# Patient Record
Sex: Female | Born: 1935 | Race: White | Hispanic: No | Marital: Married | Smoking: Former smoker
Health system: Southern US, Community
[De-identification: ages and names within clinical notes are randomized; demographics above are authoritative.]

## PROBLEM LIST (undated history)

## (undated) DIAGNOSIS — F0390 Unspecified dementia without behavioral disturbance: Secondary | ICD-10-CM

## (undated) DIAGNOSIS — J45909 Unspecified asthma, uncomplicated: Secondary | ICD-10-CM

## (undated) DIAGNOSIS — J449 Chronic obstructive pulmonary disease, unspecified: Secondary | ICD-10-CM

## (undated) HISTORY — PX: NASAL POLYP SURGERY: SHX186

## (undated) HISTORY — PX: CATARACT EXTRACTION: SUR2

---

## 2004-04-12 ENCOUNTER — Ambulatory Visit: Payer: Self-pay | Admitting: Ophthalmology

## 2004-07-26 ENCOUNTER — Other Ambulatory Visit: Payer: Self-pay

## 2004-07-28 ENCOUNTER — Inpatient Hospital Stay: Payer: Self-pay | Admitting: Internal Medicine

## 2004-11-05 ENCOUNTER — Ambulatory Visit: Payer: Self-pay | Admitting: Internal Medicine

## 2006-11-27 ENCOUNTER — Ambulatory Visit: Payer: Self-pay | Admitting: Internal Medicine

## 2007-11-27 ENCOUNTER — Ambulatory Visit: Payer: Self-pay | Admitting: Internal Medicine

## 2011-09-05 DEATH — deceased

## 2012-06-28 ENCOUNTER — Ambulatory Visit: Payer: Self-pay | Admitting: Internal Medicine

## 2012-12-31 ENCOUNTER — Ambulatory Visit: Payer: Self-pay | Admitting: Specialist

## 2013-07-02 ENCOUNTER — Other Ambulatory Visit: Payer: Self-pay | Admitting: Internal Medicine

## 2013-07-07 LAB — CULTURE, BLOOD (SINGLE)

## 2014-06-02 IMAGING — CT CT HEAD WITHOUT AND WITH CONTRAST
1 of 2 series · 13 of 30 positions shown, 17 images · non-contrast
Comparison: none

REASON FOR EXAM: Confusion Weight Loss Poss Lung Mass
COMMENTS:

[Series 2: without · axial · non-contrast · 0.40mm/px · z∈[-170,-50]mm · 13 of 30 slices shown, 17 images]
[im 3/30  brain]
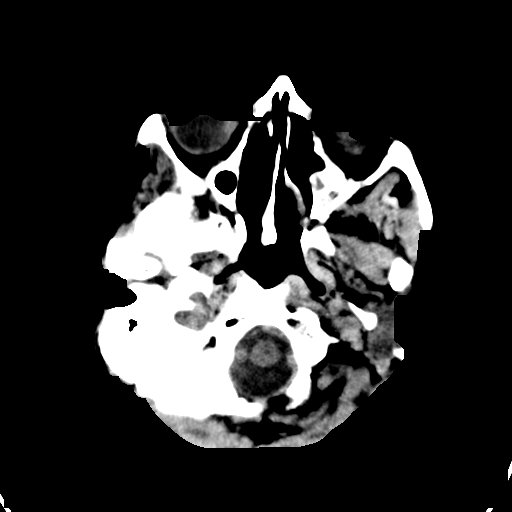
[im 3/30  bone]
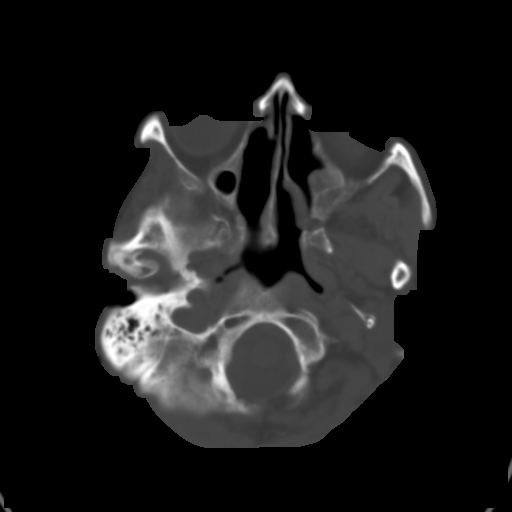
[im 5/30  brain]
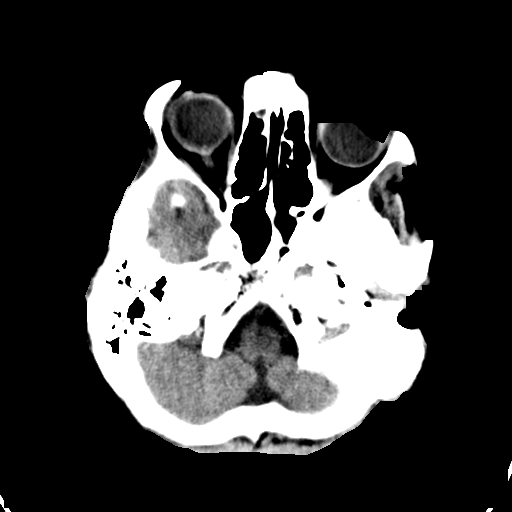
[im 7/30  brain]
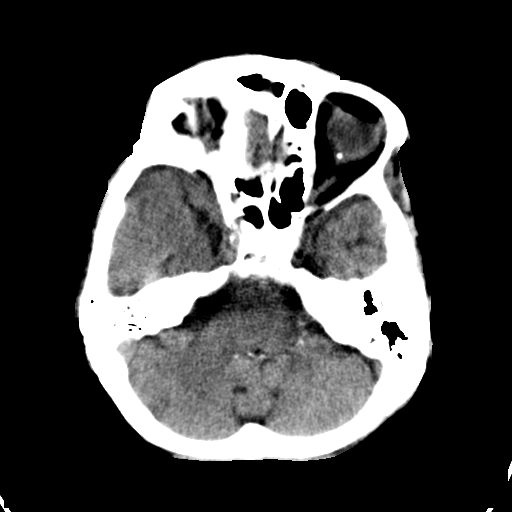
[im 9/30  brain]
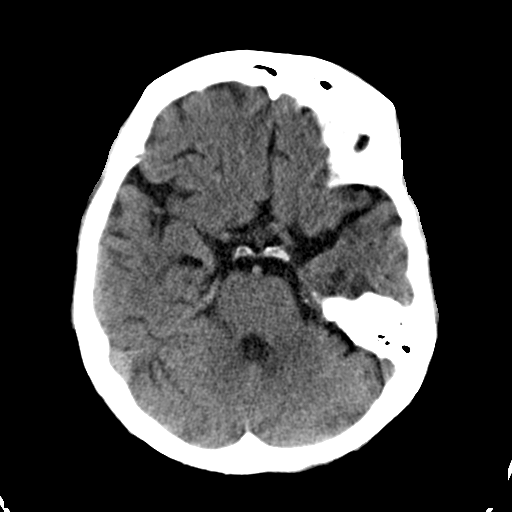
[im 11/30  brain]
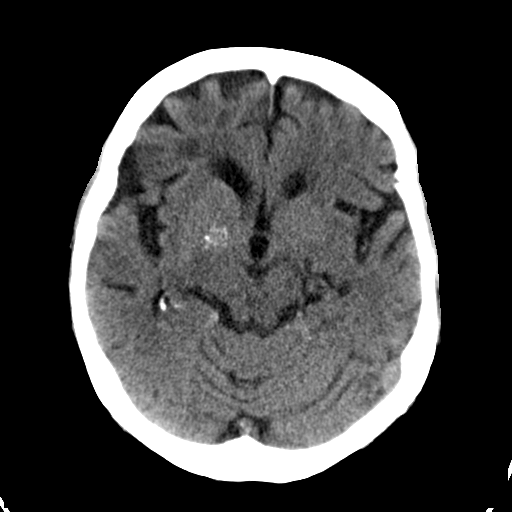
[im 11/30  bone]
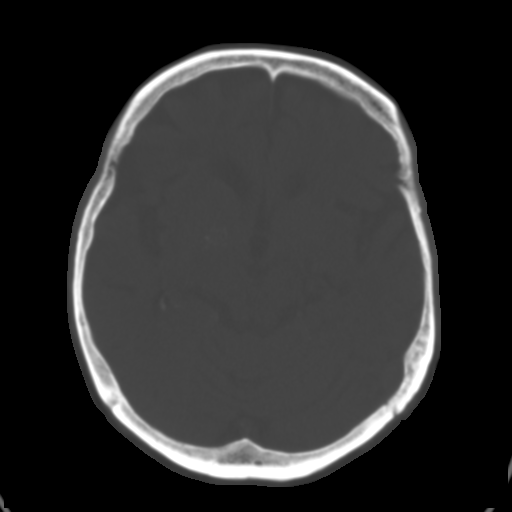
[im 13/30  brain]
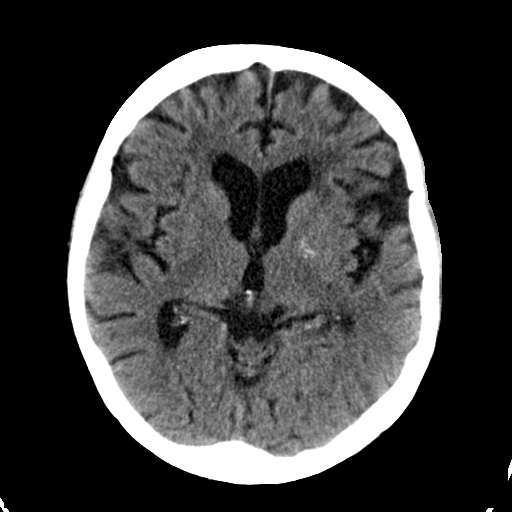
[im 15/30  brain]
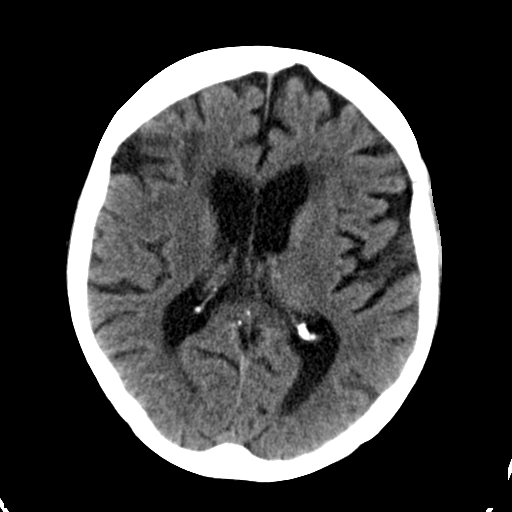
[im 17/30  brain]
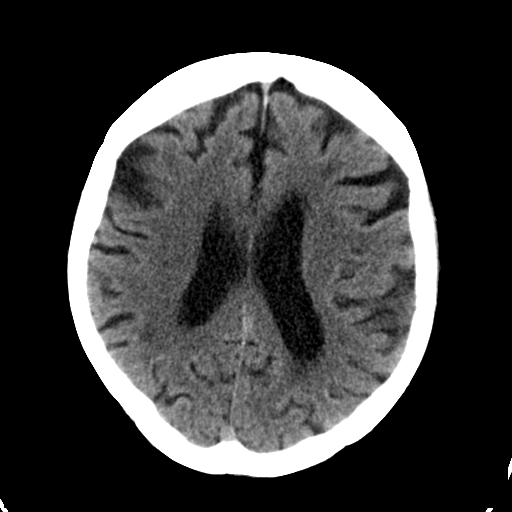
[im 19/30  brain]
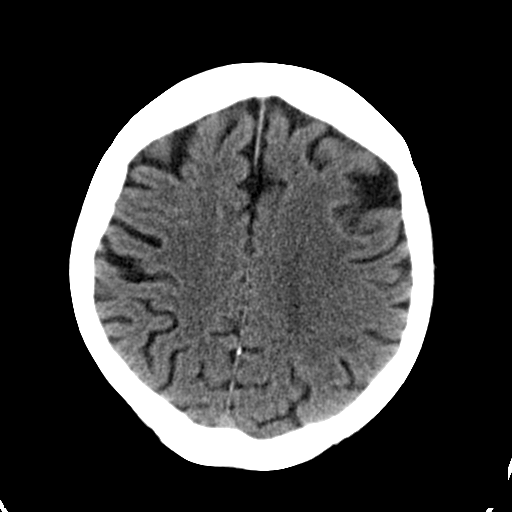
[im 19/30  bone]
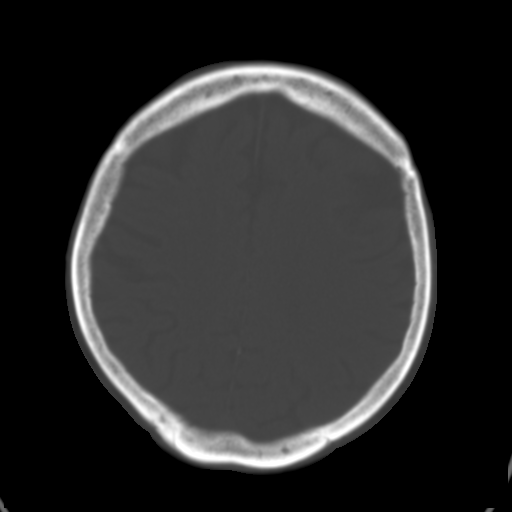
[im 21/30  brain]
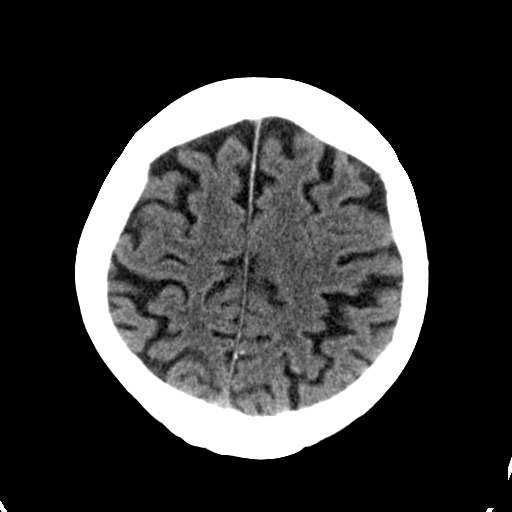
[im 23/30  brain]
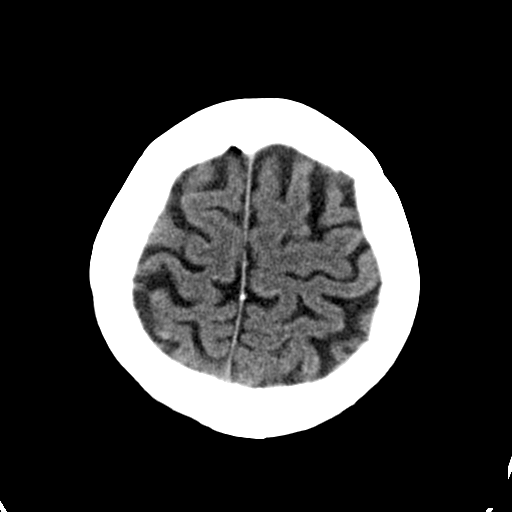
[im 25/30  brain]
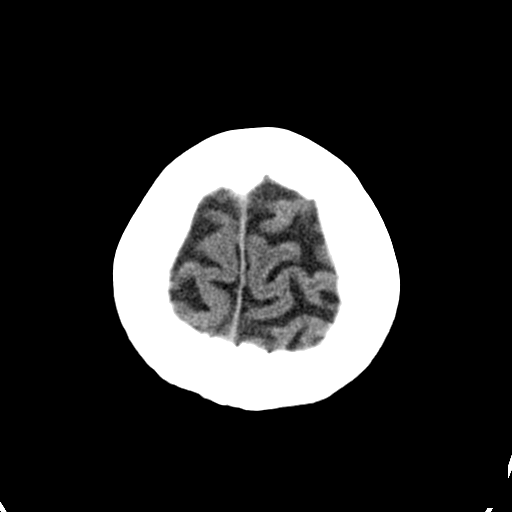
[im 27/30  brain]
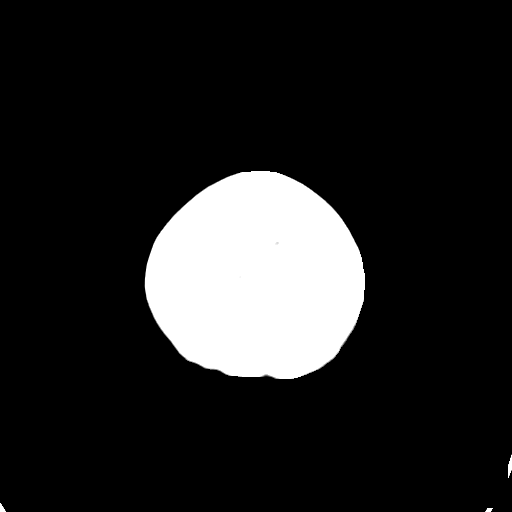
[im 27/30  bone]
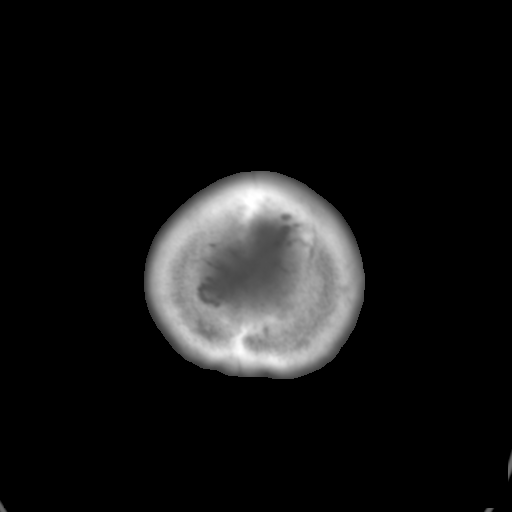

[13 of 30 positions shown; findings below may reference images not displayed]

PROCEDURE:     CT  - CT HEAD W/WO  - June 28, 2012 [DATE]

RESULT:     CT of the brain without and with contrast is performed utilizing
70 mL of Qsovue-DD2 iodinated intravenous contrast. The patient has no
previous exam for comparison.

Precontrast images demonstrate mild prominence of the ventricles and sulci.
Symmetric bilateral basal ganglia calcification is present. There is
low-attenuation diffusely in the periventricular and subcortical white
matter. There is no intracranial hemorrhage, mass, mass effect or midline
shift. No territorial infarct is evident. Images obtained following
intravenous administration of contrast demonstrate no evidence of abnormal
enhancement. There is an air-fluid level in the left maxillary sinus.
Patient has a history of sinusitis previous sinus surgery. There is partial
opacification in the left mastoid air cells extending into the left mastoid
tip there is some sclerosis. The right mastoid appears to be normally
aerated. There is no depressed skull fracture. No lytic or sclerotic
calvarial lesion is demonstrated.
IMPRESSION: 1. Changes of atrophy with chronic microvascular ischemic disease. No acute
intracranial abnormality demonstrated.
2. Air-fluid level in the left maxillary sinus. Correlate for acute
sinusitis.
3. Chronic changes in the left mastoid as described.
4. No evidence of an acute or chronic intracranial mass, mass effect or
evidence of hemorrhage.

[REDACTED]

## 2016-04-28 ENCOUNTER — Inpatient Hospital Stay
Admission: EM | Admit: 2016-04-28 | Discharge: 2016-05-05 | DRG: 193 | Disposition: E | Payer: Medicare Other | Attending: Internal Medicine | Admitting: Internal Medicine

## 2016-04-28 ENCOUNTER — Emergency Department: Payer: Medicare Other

## 2016-04-28 ENCOUNTER — Encounter: Payer: Self-pay | Admitting: Emergency Medicine

## 2016-04-28 DIAGNOSIS — J440 Chronic obstructive pulmonary disease with acute lower respiratory infection: Secondary | ICD-10-CM | POA: Diagnosis present

## 2016-04-28 DIAGNOSIS — R4182 Altered mental status, unspecified: Secondary | ICD-10-CM | POA: Diagnosis present

## 2016-04-28 DIAGNOSIS — Z66 Do not resuscitate: Secondary | ICD-10-CM | POA: Diagnosis present

## 2016-04-28 DIAGNOSIS — Z515 Encounter for palliative care: Secondary | ICD-10-CM | POA: Diagnosis present

## 2016-04-28 DIAGNOSIS — F0280 Dementia in other diseases classified elsewhere without behavioral disturbance: Secondary | ICD-10-CM | POA: Diagnosis present

## 2016-04-28 DIAGNOSIS — F0390 Unspecified dementia without behavioral disturbance: Secondary | ICD-10-CM | POA: Diagnosis present

## 2016-04-28 DIAGNOSIS — E875 Hyperkalemia: Secondary | ICD-10-CM | POA: Diagnosis not present

## 2016-04-28 DIAGNOSIS — J181 Lobar pneumonia, unspecified organism: Secondary | ICD-10-CM

## 2016-04-28 DIAGNOSIS — G92 Toxic encephalopathy: Secondary | ICD-10-CM | POA: Diagnosis present

## 2016-04-28 DIAGNOSIS — Z87891 Personal history of nicotine dependence: Secondary | ICD-10-CM

## 2016-04-28 DIAGNOSIS — J449 Chronic obstructive pulmonary disease, unspecified: Secondary | ICD-10-CM | POA: Diagnosis present

## 2016-04-28 DIAGNOSIS — J441 Chronic obstructive pulmonary disease with (acute) exacerbation: Secondary | ICD-10-CM | POA: Diagnosis present

## 2016-04-28 DIAGNOSIS — Z681 Body mass index (BMI) 19 or less, adult: Secondary | ICD-10-CM

## 2016-04-28 DIAGNOSIS — H9190 Unspecified hearing loss, unspecified ear: Secondary | ICD-10-CM | POA: Diagnosis present

## 2016-04-28 DIAGNOSIS — E43 Unspecified severe protein-calorie malnutrition: Secondary | ICD-10-CM | POA: Diagnosis present

## 2016-04-28 DIAGNOSIS — J9602 Acute respiratory failure with hypercapnia: Secondary | ICD-10-CM | POA: Diagnosis present

## 2016-04-28 DIAGNOSIS — J9601 Acute respiratory failure with hypoxia: Secondary | ICD-10-CM | POA: Diagnosis present

## 2016-04-28 DIAGNOSIS — J189 Pneumonia, unspecified organism: Principal | ICD-10-CM | POA: Diagnosis present

## 2016-04-28 HISTORY — DX: Unspecified dementia, unspecified severity, without behavioral disturbance, psychotic disturbance, mood disturbance, and anxiety: F03.90

## 2016-04-28 HISTORY — DX: Unspecified asthma, uncomplicated: J45.909

## 2016-04-28 HISTORY — DX: Chronic obstructive pulmonary disease, unspecified: J44.9

## 2016-04-28 LAB — BASIC METABOLIC PANEL
ANION GAP: 6 (ref 5–15)
BUN: 32 mg/dL — ABNORMAL HIGH (ref 6–20)
CHLORIDE: 103 mmol/L (ref 101–111)
CO2: 32 mmol/L (ref 22–32)
Calcium: 9.7 mg/dL (ref 8.9–10.3)
Creatinine, Ser: 0.66 mg/dL (ref 0.44–1.00)
GFR calc non Af Amer: >60 mL/min (ref >60)
Glucose, Bld: 107 mg/dL — ABNORMAL HIGH (ref 65–99)
POTASSIUM: 4.9 mmol/L (ref 3.5–5.1)
SODIUM: 141 mmol/L (ref 135–145)

## 2016-04-28 LAB — BLOOD GAS, ARTERIAL
ALLENS TEST (PASS/FAIL): POSITIVE — AB
Acid-Base Excess: 5.2 mmol/L — ABNORMAL HIGH (ref 0.0–2.0)
BICARBONATE: 33.5 mmol/L — AB (ref 20.0–28.0)
FIO2: 0.28
O2 SAT: 94.3 %
PATIENT TEMPERATURE: 37
PCO2 ART: 65 mmHg — AB (ref 32.0–48.0)
PO2 ART: 78 mmHg — AB (ref 83.0–108.0)
pH, Arterial: 7.32 — ABNORMAL LOW (ref 7.350–7.450)

## 2016-04-28 LAB — CBC
HEMATOCRIT: 44.3 % (ref 35.0–47.0)
HEMOGLOBIN: 14.8 g/dL (ref 12.0–16.0)
MCH: 30.3 pg (ref 26.0–34.0)
MCHC: 33.4 g/dL (ref 32.0–36.0)
MCV: 90.7 fL (ref 80.0–100.0)
Platelets: 180 10*3/uL (ref 150–440)
RBC: 4.89 MIL/uL (ref 3.80–5.20)
RDW: 14.3 % (ref 11.5–14.5)
WBC: 6.6 10*3/uL (ref 3.6–11.0)

## 2016-04-28 LAB — URINALYSIS, COMPLETE (UACMP) WITH MICROSCOPIC
BACTERIA UA: NONE SEEN
BILIRUBIN URINE: NEGATIVE
Glucose, UA: NEGATIVE mg/dL
HGB URINE DIPSTICK: NEGATIVE
KETONES UR: 5 mg/dL — AB
LEUKOCYTES UA: NEGATIVE
NITRITE: NEGATIVE
Protein, ur: 30 mg/dL — AB
SQUAMOUS EPITHELIAL / LPF: NONE SEEN
Specific Gravity, Urine: >1.046 — ABNORMAL HIGH (ref 1.005–1.030)
pH: 5 (ref 5.0–8.0)

## 2016-04-28 LAB — LACTIC ACID, PLASMA: Lactic Acid, Venous: 1.3 mmol/L (ref 0.5–1.9)

## 2016-04-28 LAB — GLUCOSE, CAPILLARY: GLUCOSE-CAPILLARY: 86 mg/dL (ref 65–99)

## 2016-04-28 LAB — TROPONIN I: Troponin I: <0.03 ng/mL (ref <0.03)

## 2016-04-28 MED ORDER — ENOXAPARIN SODIUM 40 MG/0.4ML ~~LOC~~ SOLN: 40.0000 mg | SUBCUTANEOUS | Status: DC

## 2016-04-28 MED ORDER — ONDANSETRON HCL 4 MG/2ML IJ SOLN: 4.0000 mg | Freq: Four times a day (QID) | INTRAMUSCULAR | Status: DC | PRN

## 2016-04-28 MED ORDER — ACETAMINOPHEN 650 MG RE SUPP
650.0000 mg | Freq: Four times a day (QID) | RECTAL | Status: DC | PRN
Start: 2016-04-28 — End: 2016-05-05

## 2016-04-28 MED ORDER — DEXTROSE 5 % IV SOLN
500.0000 mg | INTRAVENOUS | Status: DC
  Administered 2016-04-29: 500 mg via INTRAVENOUS
  Filled 2016-04-28 (×3): qty 500

## 2016-04-28 MED ORDER — DEXTROSE 5 % IV SOLN: 1.0000 g | Freq: Once | INTRAVENOUS | Status: DC

## 2016-04-28 MED ORDER — DEXTROSE 5 % IV SOLN
1.0000 g | INTRAVENOUS | Status: DC
  Administered 2016-04-29 – 2016-04-30 (×2): 1 g via INTRAVENOUS
  Filled 2016-04-28 (×3): qty 10

## 2016-04-28 MED ORDER — MOMETASONE FURO-FORMOTEROL FUM 200-5 MCG/ACT IN AERO
2.0000 | INHALATION_SPRAY | Freq: Two times a day (BID) | RESPIRATORY_TRACT | Status: DC
  Administered 2016-04-29: 2 via RESPIRATORY_TRACT
  Filled 2016-04-28: qty 8.8

## 2016-04-28 MED ORDER — IPRATROPIUM-ALBUTEROL 0.5-2.5 (3) MG/3ML IN SOLN: 3.0000 mL | RESPIRATORY_TRACT | Status: DC | PRN

## 2016-04-28 MED ORDER — ACETAMINOPHEN 325 MG PO TABS: 650.0000 mg | ORAL_TABLET | Freq: Four times a day (QID) | ORAL | Status: DC | PRN

## 2016-04-28 MED ORDER — ENOXAPARIN SODIUM 30 MG/0.3ML ~~LOC~~ SOLN
30.0000 mg | SUBCUTANEOUS | Status: DC
  Administered 2016-04-29: 30 mg via SUBCUTANEOUS
  Filled 2016-04-28: qty 0.3

## 2016-04-28 MED ORDER — DEXTROSE 5 % IV SOLN
500.0000 mg | Freq: Once | INTRAVENOUS | Status: AC
  Administered 2016-04-28: 500 mg via INTRAVENOUS
  Filled 2016-04-28: qty 500

## 2016-04-28 MED ORDER — IOPAMIDOL (ISOVUE-300) INJECTION 61%
75.0000 mL | Freq: Once | INTRAVENOUS | Status: AC | PRN
  Administered 2016-04-28: 75 mL via INTRAVENOUS

## 2016-04-28 MED ORDER — CEFTRIAXONE SODIUM-DEXTROSE 1-3.74 GM-% IV SOLR
1.0000 g | Freq: Once | INTRAVENOUS | Status: AC
  Administered 2016-04-28: 1 g via INTRAVENOUS
  Filled 2016-04-28: qty 50

## 2016-04-28 MED ORDER — ONDANSETRON HCL 4 MG PO TABS: 4.0000 mg | ORAL_TABLET | Freq: Four times a day (QID) | ORAL | Status: DC | PRN

## 2016-04-28 MED ORDER — SODIUM CHLORIDE 0.9 % IV SOLN
INTRAVENOUS | Status: AC
  Administered 2016-04-28: 50 mL/h via INTRAVENOUS

## 2016-04-28 NOTE — Progress Notes (Signed)
Pharmacy Antibiotic Note  [name] is a 83 y.o. female admitted on 11/21/16 with pneumonia.  Pharmacy has been consulted for ceftriaxone dosing.  Plan: Ceftriaxone 1 gram q 24 hours.  Height: 5\' 5"  (165.1 cm) Weight: 85 lb (38.6 kg) IBW/kg (Calculated) : 57  Temp (24hrs), Avg:98.2 F (36.8 C), Min:98.2 F (36.8 C), Max:98.2 F (36.8 C)   Recent Labs Lab 04-Mar-2017 1657 04-Mar-2017 2037  WBC 6.6  --   CREATININE 0.66  --   LATICACIDVEN  --  1.3    Estimated Creatinine Clearance: 31.9 mL/min (by C-G formula based on SCr of 0.66 mg/dL).    No Known Allergies  Antimicrobials this admission: azithromycin ceftriaxone 2/22 >>    >>   Dose adjustments this admission:   Microbiology results: 2/22 BCx: pending 2/22 MRSA PCR: pending     2/22 CXR: ? infiltrate  Thank you for allowing pharmacy to be a part of this patient's care.  Maebry Obrien S 11/21/16 11:27 PM

## 2016-04-28 NOTE — H&P (Signed)
[facility] [place] [facility]   PATIENT NAME: Loretta ParmaJanet Shoff    MR#:  [id number]  DATE OF BIRTH:  04/15/1929  DATE OF ADMISSION:  09/22/2016  PRIMARY CARE PHYSICIAN: PROVIDER NOT IN SYSTEM   REQUESTING/REFERRING PHYSICIAN: Shaune PollackLord, MD  CHIEF COMPLAINT:   Chief Complaint  Patient presents with  . Weakness  . Cough    HISTORY OF PRESENT ILLNESS:  Loretta Saunders  is a 83 y.o. female who presents with Progressive weakness and cough for the past 7-10 days, with a significant decline in her mental status for the past 24 hours. Patient has baseline dementia, but is normally fairly high functioning and terms of her ability to ambulate and perform tasks of daily living. Over the past week or so she has had somewhat of a decline with increasing cough, and today she was very lethargic and minimally responsive with family. Here in the ED she was found to have pneumonia and combined hypoxic and hypercapnic respiratory failure. BiPAP was placed and hospitalists were called for admission and further treatment. Family states the patient did not want any significant or extremely aggressive intervention, but is willing to allow the use of BiPAP and IV antibiotics as this may very well be a straightforward and treatable pathology. Patient herself is extremely hard of hearing, and due to this in addition to her current diagnoses she was unable to contribute in any significant way to her history of present illness.Marland Kitchen.  PAST MEDICAL HISTORY:   Past Medical History:  Diagnosis Date  . Asthma   . COPD (chronic obstructive pulmonary disease) (HCC)   . Dementia     PAST SURGICAL HISTORY:   Past Surgical History:  Procedure Laterality Date  . CATARACT EXTRACTION    . NASAL POLYP SURGERY      SOCIAL HISTORY:   Social History  Substance Use Topics  . Smoking status: Former Games developermoker  . Smokeless tobacco: Never Used  . Alcohol use No    FAMILY HISTORY:   Family History   Problem Relation Age of Onset  . Diabetes Mother   . CAD Father   . Diabetes Brother     DRUG ALLERGIES:  No Known Allergies  MEDICATIONS AT HOME:   Prior to Admission medications   Medication Sig Start Date End Date Taking? Authorizing Provider  ADVAIR DISKUS 250-50 MCG/DOSE AEPB Inhale 1 puff into the lungs 2 (two) times daily.   Yes Historical Provider, MD  albuterol (PROVENTIL HFA;VENTOLIN HFA) 108 (90 Base) MCG/ACT inhaler Inhale 1-2 puffs into the lungs every 6 (six) hours as needed for wheezing or shortness of breath.   Yes Historical Provider, MD  fluticasone (FLONASE) 50 MCG/ACT nasal spray Place 2 sprays into both nostrils daily as needed for allergies.   Yes Historical Provider, MD    REVIEW OF SYSTEMS:  Review of Systems  Unable to perform ROS: Acuity of condition     VITAL SIGNS:   Vitals:   05-23-16 1730 05-23-16 1800 05-23-16 1930 05-23-16 2100  BP: (!) 149/132 128/71 114/69   Pulse:   77   Resp: 20 (!) 32    Temp:      TempSrc:      SpO2:   100% 100%  Weight:      Height:       Wt Readings from Last 3 Encounters:  05-23-16 38.6 kg (85 lb)    PHYSICAL EXAMINATION:  Physical Exam  Vitals reviewed. Constitutional: She appears well-developed and well-nourished. No distress.  HENT:  Head: Normocephalic and atraumatic.  Mouth/Throat: Oropharynx is clear and moist.  Eyes: Conjunctivae and EOM are normal. Pupils are equal, round, and reactive to light. No scleral icterus.  Neck: Normal range of motion. Neck supple. No JVD present. No thyromegaly present.  Cardiovascular: Normal rate, regular rhythm and intact distal pulses.  Exam reveals no gallop and no friction rub.   No murmur heard. Respiratory: She is in respiratory distress. She has no wheezes. She has no rales.  Coarse breath sounds  GI: Soft. Bowel sounds are normal. She exhibits no distension. There is no tenderness.  Musculoskeletal: Normal range of motion. She exhibits no edema.  No  arthritis, no gout  Lymphadenopathy:    She has no cervical adenopathy.  Neurological:  Unable to assess due to patient condition and dementia  Skin: Skin is warm and dry. No rash noted. No erythema.  Psychiatric:  Unable to assess due to patient condition and dementia    LABORATORY PANEL:   CBC  Recent Labs Lab May 11, 2016 1657  WBC 6.6  HGB 14.8  HCT 44.3  PLT 180   ------------------------------------------------------------------------------------------------------------------  Chemistries   Recent Labs Lab 05/11/16 1657  NA 141  K 4.9  CL 103  CO2 32  GLUCOSE 107*  BUN 32*  CREATININE 0.66  CALCIUM 9.7   ------------------------------------------------------------------------------------------------------------------  Cardiac Enzymes  Recent Labs Lab May 11, 2016 1657  TROPONINI <0.03   ------------------------------------------------------------------------------------------------------------------  RADIOLOGY:  Dg Chest 2 View  Result Date: May 11, 2016 CLINICAL DATA:  Strong productive cough for three years, history COPD, dementia, former smoker EXAM: CHEST  2 VIEW COMPARISON:  CT chest 12/31/2012 FINDINGS: Mild enlargement of cardiac silhouette with minimal pulmonary vascular congestion. Atherosclerotic calcification aorta. Emphysematous and bronchitic changes consistent with COPD. RIGHT apical scarring. Poorly defined opacity in the RIGHT mid lung, question infiltrate, mass not excluded. Remaining lungs clear. No pleural effusion or pneumothorax. Bones diffusely demineralized. IMPRESSION: COPD changes with questionable infiltrate versus mass in the RIGHT mid lung, not definitely localized on lateral view, recommend further assessment by CT imaging to exclude tumor. Mild enlargement of cardiac silhouette with aortic atherosclerosis. Electronically Signed   By: Ulyses Southward M.D.   On: 05/11/2016 17:56   Ct Chest W Contrast  Result Date: 2016/05/11 CLINICAL DATA:   Weakness and cough. EXAM: CT CHEST WITH CONTRAST TECHNIQUE: Multidetector CT imaging of the chest was performed during intravenous contrast administration. CONTRAST:  75mL ISOVUE-300 IOPAMIDOL (ISOVUE-300) INJECTION 61% COMPARISON:  12/31/2012 FINDINGS: Cardiovascular: Heart size upper normal. Coronary artery calcification is noted. Atherosclerotic calcification is noted in the wall of the thoracic aorta. No large central pulmonary embolus. Mediastinum/Nodes: No mediastinal lymphadenopathy. There is no hilar lymphadenopathy. The esophagus has normal imaging features. There is no axillary lymphadenopathy. Lungs/Pleura: Biapical pleural-parenchymal scarring again noted. Central right upper lobe atelectasis with associated airway impaction is more prominent than previous. Numerous other areas of small airway impaction are seen in the lungs bilaterally. Scattered areas of peribronchovascular nodularity are seen in the bilateral upper lobes, lingula, and both lower lobes. There is collapse/consolidation in the right middle lobe, new in the interval. Prominent bronchial wall thickening identified bilaterally. No overtly suspicious pulmonary nodule or mass. Upper Abdomen: Heterogeneous enhancement left liver with potential area of biliary dilatation. Musculoskeletal: Old sternal fracture noted. Bone windows reveal no worrisome lytic or sclerotic osseous lesions. IMPRESSION: 1. Numerous areas of peribronchovascular nodularity distributed through both lungs. This is associated with widespread atelectasis, bronchial wall thickening, and scattered areas of small airway impaction. Imaging  features are compatible with chronic atypical lung infection. 2. Interval development of right middle lobe collapse/consolidation. This may be postobstructive related to the atypical infection. Broncho pneumonia not entirely excluded. 3.  Emphysema. (ZOX09-U04.9) Electronically Signed   By: Kennith Center M.D.   On: 04/25/2016 19:18    EKG:    Orders placed or performed during the hospital encounter of 04/16/2016  . ED EKG  . ED EKG  . EKG 12-Lead  . EKG 12-Lead    IMPRESSION AND PLAN:  Principal Problem:   Acute respiratory failure with hypoxia and hypercapnia (HCC) - patient is currently on BiPAP, we will continue this for this time as she is tolerating it well. Repeat ABG tonight and wean as possible. Active Problems:   CAP (community acquired pneumonia) - IV antibiotics, cultures sent from the ED, when necessary DuoNeb's   Dementia - we will continue to monitor, patient is not on any medications for this, we will treat as necessary if any behavioral concerns arise   COPD (chronic obstructive pulmonary disease) (HCC) - continue home inhalers as well as other supportive therapies as above  All the records are reviewed and case discussed with ED provider. Management plans discussed with the patient and/or family.  DVT PROPHYLAXIS: SubQ lovenox  GI PROPHYLAXIS: None  ADMISSION STATUS: Inpatient  CODE STATUS: DNR Code Status History    This patient does not have a recorded code status. Please follow your organizational policy for patients in this situation.    Advance Directive Documentation   Flowsheet Row Most Recent Value  Type of Advance Directive  Out of facility DNR (pink MOST or yellow form)  Pre-existing out of facility DNR order (yellow form or pink MOST form)  No data  "MOST" Form in Place?  No data      TOTAL TIME TAKING CARE OF THIS PATIENT: 45 minutes.    Javarious Elsayed FIELDING 04/29/2016, 9:32 PM  [facility]  Office  [phone]  CC: Primary care physician; PROVIDER NOT IN SYSTEM

## 2016-04-28 NOTE — ED Notes (Signed)
Patient transported to CT 

## 2016-04-28 NOTE — Progress Notes (Signed)
Lovenox changed to 30 mg daily for TBW <45kg and CrCl >30. 

## 2016-04-28 NOTE — ED Provider Notes (Addendum)
[facility] Emergency Department Provider Note ____________________________________________   I have reviewed the triage vital signs and the triage nursing note.  HISTORY  Chief Complaint Weakness and Cough   Historian History limited by patient dementia and altered mental status Patient's son provides most of the history Husband also provides history   HPI Loretta Saunders is a 83 y.o. female With dementia for 14 years, lives at home with her husband, she also has COPD and has been told at one point time to wear oxygen, but she mostly does not wear it at home. She typically is pretty feisty and is active, but this morning was not really getting out of bed and not interacting. She had altered mental status and appeared to be breathing hard and not really brought her in for evaluation.  She is a DNI AND DNR and they are not interested in any invasive supportive management but are interested and diagnosis at this point time.    Past Medical History:  Diagnosis Date  . Asthma   . COPD (chronic obstructive pulmonary disease) (HCC)   . Dementia     There are no active problems to display for this patient.   Past Surgical History:  Procedure Laterality Date  . CATARACT EXTRACTION    . NASAL POLYP SURGERY      Prior to Admission medications   Medication Sig Start Date End Date Taking? Authorizing Provider  ADVAIR DISKUS 250-50 MCG/DOSE AEPB Inhale 1 puff into the lungs 2 (two) times daily.   Yes Historical Provider, MD  albuterol (PROVENTIL HFA;VENTOLIN HFA) 108 (90 Base) MCG/ACT inhaler Inhale 1-2 puffs into the lungs every 6 (six) hours as needed for wheezing or shortness of breath.   Yes Historical Provider, MD  fluticasone (FLONASE) 50 MCG/ACT nasal spray Place 2 sprays into both nostrils daily as needed for allergies.   Yes Historical Provider, MD    No Known Allergies  Family History  Problem Relation Age of Onset  . Diabetes Mother   . CAD  Father   . Diabetes Brother     Social History Social History  Substance Use Topics  . Smoking status: Former Games developermoker  . Smokeless tobacco: Never Used  . Alcohol use No    Review of Systems Limited due to patient altered mental status Constitutional: Negative for fever. Eyes: Negative for red eyes. ENT: Negative for nasal congestion Cardiovascular: Negative for chest pain. Respiratory: Worsening chronic cough Gastrointestinal: Negative for vomiting and diarrhea. Genitourinary:  Musculoskeletal:  Skin: Negative for rash. Neurological:  10 point Review of Systems otherwise negative ____________________________________________   PHYSICAL EXAM:  VITAL SIGNS: ED Triage Vitals  Enc Vitals Group     BP 12/26/16 1638 140/70     Pulse --      Resp 12/26/16 1638 (!) 22     Temp 12/26/16 1638 98.2 F (36.8 C)     Temp Source 12/26/16 1638 Oral     SpO2 --      Weight 12/26/16 1646 85 lb (38.6 kg)     Height 12/26/16 1646 5\' 5"  (1.651 m)     Head Circumference --      Peak Flow --      Pain Score --      Pain Loc --      Pain Edu? --      Excl. in GC? --      Constitutional: At times opens eyes to voice, most the time is sleeping.  Cachectic HEENT  Head: Normocephalic and atraumatic.      Eyes: Conjunctivae are normal. PERRL. Normal extraocular movements.      Ears:         Nose: No congestion/rhinnorhea.   Mouth/Throat: Mucous membranes are moderately dry.   Neck: No stridor. Cardiovascular/Chest: Normal rate, regular rhythm.  No murmurs, rubs, or gallops. Respiratory: Breathing through her mouth. Significant rhonchi bilaterally. No wheezing. Somewhat decreased air movement throughout all fields. Occasionally mild retractions. Gastrointestinal: Soft. No distention, no guarding, no rebound. Nontender.   Genitourinary/rectal:Deferred Musculoskeletal: Nontender with normal range of motion in all extremities. No joint effusions.  No lower extremity tenderness.   No edema. Neurologic:  No facial droop. Patient is not really verbal today. Not really interacting to follow commands for full neurologic exam, but she does move 4 extremities. Skin:  Skin is warm, dry and intact. No rash noted. Psychiatric: No agitation.   ____________________________________________  LABS (pertinent positives/negatives)  Labs Reviewed  BASIC METABOLIC PANEL - Abnormal; Notable for the following:       Result Value   Glucose, Bld 107 (*)    BUN 32 (*)    All other components within normal limits  URINALYSIS, COMPLETE (UACMP) WITH MICROSCOPIC - Abnormal; Notable for the following:    Color, Urine YELLOW (*)    APPearance CLEAR (*)    Specific Gravity, Urine >1.046 (*)    Ketones, ur 5 (*)    Protein, ur 30 (*)    All other components within normal limits  BLOOD GAS, ARTERIAL - Abnormal; Notable for the following:    pH, Arterial 7.32 (*)    pCO2 arterial 65 (*)    pO2, Arterial 78 (*)    Bicarbonate 33.5 (*)    Acid-Base Excess 5.2 (*)    Allens test (pass/fail) POSITIVE (*)    All other components within normal limits  CULTURE, BLOOD (ROUTINE X 2)  CULTURE, BLOOD (ROUTINE X 2)  CBC  TROPONIN I  LACTIC ACID, PLASMA  LACTIC ACID, PLASMA  CBG MONITORING, ED    ____________________________________________    EKG I, Governor Rooks, MD, the attending physician have personally viewed and interpreted all ECGs.  70 beats per minute. normal sinus rhythm. Narrow QRS. Normal axis. Nonspecific ST and T-wave ____________________________________________  RADIOLOGY All Xrays were viewed by me. Imaging interpreted by Radiologist.  Chest x-ray two-view:  IMPRESSION: COPD changes with questionable infiltrate versus mass in the RIGHT mid lung, not definitely localized on lateral view, recommend further assessment by CT imaging to exclude tumor.  CT chest without contrast:  IMPRESSION: 1. Numerous areas of peribronchovascular nodularity distributed through both  lungs. This is associated with widespread atelectasis, bronchial wall thickening, and scattered areas of small airway impaction. Imaging features are compatible with chronic atypical lung infection. 2. Interval development of right middle lobe collapse/consolidation. This may be postobstructive related to the atypical infection. Broncho pneumonia not entirely excluded. 3. Emphysema. (ZOX09-U04.9) __________________________________________  PROCEDURES  Procedure(s) performed: None  Critical Care performed: CRITICAL CARE Performed by: Governor Rooks   Total critical care time: 30 minutes  Critical care time was exclusive of separately billable procedures and treating other patients.  Critical care was necessary to treat or prevent imminent or life-threatening deterioration.  Critical care was time spent personally by me on the following activities: development of treatment plan with patient and/or surrogate as well as nursing, discussions with consultants, evaluation of patient's response to treatment, examination of patient, obtaining history from patient or surrogate, ordering and performing treatments and  interventions, ordering and review of laboratory studies, ordering and review of radiographic studies, pulse oximetry and re-evaluation of patient's condition.   ____________________________________________   ED COURSE / ASSESSMENT AND PLAN  Pertinent labs & imaging results that were available during my care of the patient were reviewed by me and considered in my medical decision making (see chart for details).    Patient was brought in with a history of dementia but altered to baseline and also reportedly short of breath. Initially it was tough to get an O2 sat to read off of her cool fingers. She was placed on 2 L nasal cannula, and eventually O2 sat did seem to be showing 95% on 2 L nasal cannula. Unclear what her baseline O2 requirement is as she has oxygen but almost never  wears at home.  I discussed with the son and the patient's husband regarding what the pills of treatment were because initially they stated that they did not want to do much investigation at all, however we decided that a dressing source of the cause of altered mental status and shortness of breath was important at least for prognostication even if they weren't interested in significantly invasive procedures.  Blood work showed normal white blood cell count initially. Chest x-ray showed possibility of underlying mass. I was going to scan the chest with contrast, but patient had no adequate IV for this, so chest CT was done without contrast. Chest CT without contrast showed evidence of bronchopneumonia versus atypical pneumonia with postobstructive collapse. Because of this I did add on blood cultures and make the patient includes sepsis with altered mental status and source of infection with likely hypoxia. She was treated with antibiotics Rocephin and azithromycin by IV.  No hypotension. Lactate was added on.  We had initially discussed doing a head CT, but given source of altered mental status with ABG showing acute hypoxic and hypercapnic respiratory failure, we'll hold off on head CT at this point in time given no history of trauma or focal neurologic deficit on exam. They would not be interested in any sort of neurosurgical procedure/intervention anyway. My suspicion for this would be extremely low.  We again discussed that with the hypercarbic respiratory failure, I would recommend BiPAP and they were okay with this as noninvasive. I spoke with Dr. Anne Hahn of the hospitalist team for admission. Family is more in alignment of palliative care, however they are open to antibiotics and BiPAP for now.     CONSULTATIONS:  Hospitalist for admission, Dr. Anne Hahn.   Patient / Family / Caregiver informed of clinical course, medical decision-making process, and agree with  plan.   ___________________________________________   FINAL CLINICAL IMPRESSION(S) / ED DIAGNOSES   Final diagnoses:  Community acquired pneumonia of right middle lobe of lung (HCC)  Acute respiratory failure with hypoxia and hypercapnia (HCC)  Altered mental status, unspecified altered mental status type              Note: This dictation was prepared with Dragon dictation. Any transcriptional errors that result from this process are unintentional    Governor Rooks, MD 2016/05/26 2125    Governor Rooks, MD 05/26/2016 2126

## 2016-04-28 NOTE — ED Triage Notes (Signed)
Pt to ED via EMS from home c/o weakness today and cough.  Per EMS patient normally walks at home but needed assistance today.  Pt with strong productive cough that son states she has had for 8 years.  Pt hx of COPD and dementia and rarely wears oxygen at home.  EMS vitals 90-95% 2L , 102 CBG.  Pt currently denying any complaints.

## 2016-04-29 DIAGNOSIS — E43 Unspecified severe protein-calorie malnutrition: Secondary | ICD-10-CM | POA: Insufficient documentation

## 2016-04-29 LAB — CBC
HCT: 39.6 % (ref 35.0–47.0)
Hemoglobin: 13.3 g/dL (ref 12.0–16.0)
MCH: 30.5 pg (ref 26.0–34.0)
MCHC: 33.6 g/dL (ref 32.0–36.0)
MCV: 90.7 fL (ref 80.0–100.0)
PLATELETS: 190 10*3/uL (ref 150–440)
RBC: 4.37 MIL/uL (ref 3.80–5.20)
RDW: 14.3 % (ref 11.5–14.5)
WBC: 6.8 10*3/uL (ref 3.6–11.0)

## 2016-04-29 LAB — BASIC METABOLIC PANEL
ANION GAP: 4 — AB (ref 5–15)
BUN: 27 mg/dL — ABNORMAL HIGH (ref 6–20)
CALCIUM: 9.3 mg/dL (ref 8.9–10.3)
CO2: 33 mmol/L — AB (ref 22–32)
Chloride: 106 mmol/L (ref 101–111)
Creatinine, Ser: 0.56 mg/dL (ref 0.44–1.00)
GFR calc non Af Amer: >60 mL/min (ref >60)
Glucose, Bld: 96 mg/dL (ref 65–99)
Potassium: 5.2 mmol/L — ABNORMAL HIGH (ref 3.5–5.1)
Sodium: 143 mmol/L (ref 135–145)

## 2016-04-29 LAB — BLOOD GAS, ARTERIAL
Acid-Base Excess: 6.2 mmol/L — ABNORMAL HIGH (ref 0.0–2.0)
Acid-Base Excess: 6.6 mmol/L — ABNORMAL HIGH (ref 0.0–2.0)
BICARBONATE: 33.7 mmol/L — AB (ref 20.0–28.0)
Bicarbonate: 34.2 mmol/L — ABNORMAL HIGH (ref 20.0–28.0)
Delivery systems: POSITIVE
Delivery systems: POSITIVE
EXPIRATORY PAP: 5
EXPIRATORY PAP: 5
FIO2: 0.3
FIO2: 0.3
INSPIRATORY PAP: 10
INSPIRATORY PAP: 10
MECHANICAL RATE: 8
Mechanical Rate: 8
O2 SAT: 96 %
O2 Saturation: 97.1 %
PH ART: 7.35 (ref 7.350–7.450)
PO2 ART: 86 mmHg (ref 83.0–108.0)
Patient temperature: 37
Patient temperature: 37
pCO2 arterial: 61 mmHg — ABNORMAL HIGH (ref 32.0–48.0)
pCO2 arterial: 62 mmHg — ABNORMAL HIGH (ref 32.0–48.0)
pH, Arterial: 7.35 (ref 7.350–7.450)
pO2, Arterial: 96 mmHg (ref 83.0–108.0)

## 2016-04-29 LAB — MRSA PCR SCREENING: MRSA by PCR: NEGATIVE

## 2016-04-29 MED ORDER — IPRATROPIUM-ALBUTEROL 0.5-2.5 (3) MG/3ML IN SOLN
3.0000 mL | RESPIRATORY_TRACT | Status: DC
  Administered 2016-04-29 – 2016-04-30 (×4): 3 mL via RESPIRATORY_TRACT
  Filled 2016-04-29 (×5): qty 3

## 2016-04-29 MED ORDER — ZOLPIDEM TARTRATE 5 MG PO TABS: 5.0000 mg | ORAL_TABLET | Freq: Every evening | ORAL | Status: DC | PRN

## 2016-04-29 MED ORDER — LORAZEPAM 2 MG/ML IJ SOLN
INTRAMUSCULAR | Status: AC
  Filled 2016-04-29: qty 1

## 2016-04-29 MED ORDER — HALOPERIDOL LACTATE 5 MG/ML IJ SOLN
5.0000 mg | Freq: Once | INTRAMUSCULAR | Status: AC
Start: 2016-04-29 — End: 2016-04-29
  Administered 2016-04-29: 5 mg via INTRAVENOUS
  Filled 2016-04-29: qty 1

## 2016-04-29 MED ORDER — METHYLPREDNISOLONE SODIUM SUCC 40 MG IJ SOLR
20.0000 mg | Freq: Two times a day (BID) | INTRAMUSCULAR | Status: DC
  Administered 2016-04-29 – 2016-04-30 (×3): 20 mg via INTRAVENOUS
  Filled 2016-04-29 (×3): qty 1

## 2016-04-29 MED ORDER — ENSURE ENLIVE PO LIQD
237.0000 mL | Freq: Two times a day (BID) | ORAL | Status: DC
  Administered 2016-04-29: 237 mL via ORAL

## 2016-04-29 MED ORDER — LORAZEPAM 2 MG/ML IJ SOLN
1.0000 mg | Freq: Once | INTRAMUSCULAR | Status: AC
  Administered 2016-04-29: 1 mg via INTRAVENOUS

## 2016-04-29 MED ORDER — SODIUM POLYSTYRENE SULFONATE 15 GM/60ML PO SUSP
30.0000 g | Freq: Once | ORAL | Status: AC
  Administered 2016-04-29: 30 g via ORAL
  Filled 2016-04-29: qty 120

## 2016-04-29 MED ORDER — BUDESONIDE 0.5 MG/2ML IN SUSP
0.5000 mg | Freq: Two times a day (BID) | RESPIRATORY_TRACT | Status: DC
  Administered 2016-04-29 – 2016-04-30 (×4): 0.5 mg via RESPIRATORY_TRACT
  Filled 2016-04-29 (×4): qty 2

## 2016-04-29 NOTE — Progress Notes (Signed)
Initial Nutrition Assessment  DOCUMENTATION CODES:   Severe malnutrition in context of chronic illness, Underweight  INTERVENTION:  -Dysphagia III diet -Ensure Enlive po BID, each supplement provides 350 kcal and 20 grams of protein -Magic Cup BID on meal trays  NUTRITION DIAGNOSIS:   Malnutrition related to chronic illness as evidenced by severe depletion of body fat, severe depletion of muscle mass.  OAL:   Patient will meet greater than or equal to 90% of their needs  MONITOR:   PO intake, Supplement acceptance, Labs, Weight trends  REASON FOR ASSESSMENT:   Malnutrition Screening Tool, Consult Assessment of nutrition requirement/status  ASSESSMENT:   83 yo female admitted with progressive weakness. Pt with acute respiratory failure with pneumonia. Pt with hx of COPD, dementia  Pt alert but disoriented. Pt reports she has a great appetite at home and eats all the time. Unsure of weight history; no previous weight encounters in chart. Pt is underweight (BMI 13.7). No recorded po intake Per [employer] RN, pt requiring soft foods and pt has back dentures but does not wear Labs: potassium 5.2 (kayexlate given) Meds: solumedrol, kayexalate Nutrition-Focused physical exam completed. Findings are severe fat depletion, severe muscle depletion, and no edema.   Diet Order: Soft diet  Skin:  Wound (see comment) (stage I left hip, sacrum)  Last BM:  no documented BM  Height:   Ht Readings from Last 1 Encounters:  09-20-16 5\' 4"  (1.626 m)    Weight:   Wt Readings from Last 1 Encounters:  09-20-16 79 lb 12.9 oz (36.2 kg)    Ideal Body Weight:  49 kg  BMI:  Body mass index is 13.7 kg/m.  Estimated Nutritional Needs:   Kcal:  1100-1300 kcals  Protein:  55-65 g  Fluid:  >/= 1.2 L  EDUCATION NEEDS:   No education needs identified at this time  Romelle StarcherCate Na Waldrip MS, RD, LDN [phone] Pager  [phone] Weekend/On-Call Pager

## 2016-04-29 NOTE — Progress Notes (Signed)
Pt has remained alert and disorientated x 4. Pleasantly confused at baseline Alzheimers dementia. Pt has remained in NSR on cardiac monitor. BP/HR WNL. Lung sounds have been coarse to auscultation. Pt with a strong, productive cough. Pt transitioned from 30% FiO2 Bi-pap to [facility] successfully this am. No distress has been noted.  Pt remains very confused and is very active at trying to get OOB. Per family-pt is very active at home and does not sit still.  Family has concerns for pt placement in a SNF before [facility] the patient [facility] has [facility]. SW has been consulted.

## 2016-04-29 NOTE — Progress Notes (Signed)
CH received an OR for prayer for a Pt in ICU06. CH visited the Pt, but Pt was asleep. CH spoke with the Pt's Nurse who was in the Rm. Per nurse the Pt was anxious and restless but the Nurse help her calm down and was resting. [facility] with this Pt later in the day.    04/29/16 0700  Clinical Encounter Type  Visited With Patient  Visit Type Initial;Spiritual support;Social support  Referral From Nurse  Consult/Referral To Chaplain  Spiritual Encounters  Spiritual Needs Prayer;Other (Comment)

## 2016-04-29 NOTE — Progress Notes (Signed)
[employer] [facility] [place] [facility]   PATIENT NAME: Loretta ParmaJanet Saunders    MR#:  [id number]  DATE OF BIRTH:  11/08/1929  SUBJECTIVE:  CHIEF COMPLAINT:   Chief Complaint  Patient presents with  . Weakness  . Cough   Baseline dementia, brought with lethargy- found to have PNA and given bipap initially, now better. Vitals stable, still lethargic.  REVIEW OF SYSTEMS:  Pt is demented, not able to give ROS.   ROS  DRUG ALLERGIES:  No Known Allergies  VITALS:  Blood pressure (!) 102/55, pulse 82, temperature 97.9 F (36.6 C), temperature source Axillary, resp. rate (!) 29, height 5\' 4"  (1.626 m), weight 36.2 kg (79 lb 12.9 oz), SpO2 97 %.  PHYSICAL EXAMINATION:  Constitutional: She appears well-developed and well-nourished. No distress.  HENT:  Head: Normocephalic and atraumatic.  Mouth/Throat: Oropharynx is clear and moist.  Eyes: Conjunctivae and EOM are normal. Pupils are equal, round, and reactive to light. No scleral icterus.  Neck: Normal range of motion. Neck supple. No JVD present. No thyromegaly present.  Cardiovascular: Normal rate, regular rhythm and intact distal pulses.  Exam reveals no gallop and no friction rub.   No murmur heard. Respiratory: She is in respiratory distress. She has no wheezes. She has no rales.  Coarse breath sounds  GI: Soft. Bowel sounds are normal. She exhibits no distension. There is no tenderness.  Musculoskeletal: Normal range of motion. She exhibits no edema.  No arthritis, no gout  Lymphadenopathy:    She has no cervical adenopathy.  Neurological:  Unable to assess due to patient condition and dementia  Skin: Skin is warm and dry. No rash noted. No erythema.  Psychiatric:  Unable to assess due to patient condition and dementia oriented x 3.  SKIN: No obvious rash, lesion, or ulcer.   Physical Exam LABORATORY PANEL:   CBC  Recent Labs Lab 04/29/16 0846  WBC 6.8  HGB 13.3  HCT 39.6  PLT 190    ------------------------------------------------------------------------------------------------------------------  Chemistries   Recent Labs Lab 04/29/16 0459  NA 143  K 5.2*  CL 106  CO2 33*  GLUCOSE 96  BUN 27*  CREATININE 0.56  CALCIUM 9.3   ------------------------------------------------------------------------------------------------------------------  Cardiac Enzymes  Recent Labs Lab 04/08/2016 1657  TROPONINI <0.03   ------------------------------------------------------------------------------------------------------------------  RADIOLOGY:  Dg Chest 2 View  Result Date: 04/27/2016 CLINICAL DATA:  Strong productive cough for three years, history COPD, dementia, former smoker EXAM: CHEST  2 VIEW COMPARISON:  CT chest 12/31/2012 FINDINGS: Mild enlargement of cardiac silhouette with minimal pulmonary vascular congestion. Atherosclerotic calcification aorta. Emphysematous and bronchitic changes consistent with COPD. RIGHT apical scarring. Poorly defined opacity in the RIGHT mid lung, question infiltrate, mass not excluded. Remaining lungs clear. No pleural effusion or pneumothorax. Bones diffusely demineralized. IMPRESSION: COPD changes with questionable infiltrate versus mass in the RIGHT mid lung, not definitely localized on lateral view, recommend further assessment by CT imaging to exclude tumor. Mild enlargement of cardiac silhouette with aortic atherosclerosis. Electronically Signed   By: Ulyses SouthwardMark  Boles M.D.   On: 04/15/2016 17:56   Ct Chest W Contrast  Result Date: 05/03/2016 CLINICAL DATA:  Weakness and cough. EXAM: CT CHEST WITH CONTRAST TECHNIQUE: Multidetector CT imaging of the chest was performed during intravenous contrast administration. CONTRAST:  75mL ISOVUE-300 IOPAMIDOL (ISOVUE-300) INJECTION 61% COMPARISON:  12/31/2012 FINDINGS: Cardiovascular: Heart size upper normal. Coronary artery calcification is noted. Atherosclerotic calcification is noted in the wall  of the thoracic aorta. No large central pulmonary embolus.  Mediastinum/Nodes: No mediastinal lymphadenopathy. There is no hilar lymphadenopathy. The esophagus has normal imaging features. There is no axillary lymphadenopathy. Lungs/Pleura: Biapical pleural-parenchymal scarring again noted. Central right upper lobe atelectasis with associated airway impaction is more prominent than previous. Numerous other areas of small airway impaction are seen in the lungs bilaterally. Scattered areas of peribronchovascular nodularity are seen in the bilateral upper lobes, lingula, and both lower lobes. There is collapse/consolidation in the right middle lobe, new in the interval. Prominent bronchial wall thickening identified bilaterally. No overtly suspicious pulmonary nodule or mass. Upper Abdomen: Heterogeneous enhancement left liver with potential area of biliary dilatation. Musculoskeletal: Old sternal fracture noted. Bone windows reveal no worrisome lytic or sclerotic osseous lesions. IMPRESSION: 1. Numerous areas of peribronchovascular nodularity distributed through both lungs. This is associated with widespread atelectasis, bronchial wall thickening, and scattered areas of small airway impaction. Imaging features are compatible with chronic atypical lung infection. 2. Interval development of right middle lobe collapse/consolidation. This may be postobstructive related to the atypical infection. Broncho pneumonia not entirely excluded. 3.  Emphysema. ([id number]) Electronically Signed   By: Kennith Center M.D.   On: 05-03-16 19:18    ASSESSMENT AND PLAN:   Principal Problem:   Acute respiratory failure with hypoxia and hypercapnia (HCC) Active Problems:   CAP (community acquired pneumonia)   Dementia   COPD (chronic obstructive pulmonary disease) (HCC)   Protein-calorie malnutrition, severe   * Ac respi failure with hpoxia   Community acquired pneumonia.   Rocephin+ azithromycin  * COPD exacerbation    Steroids, nebs. Inhaled steroids.  * hyperkalemia   Given oral kayexalate, recheck.    All the records are reviewed and case discussed with Care Management/Social Workerr. Management plans discussed with the patient, family and they are in agreement.  CODE STATUS: DNR  TOTAL TIME TAKING CARE OF THIS PATIENT: 35 minutes.     POSSIBLE D/C IN 1-2 DAYS, DEPENDING ON CLINICAL CONDITION.   Altamese Dilling M.D on 04/29/2016   Between 7am to 6pm - Pager - [phone]  After 6pm go to [email or web address] - Social research officer, government  [facility]  Office  [phone]  CC: Primary care physician; PROVIDER NOT IN SYSTEM  Note: This dictation was prepared with Dragon dictation along with smaller phrase technology. Any transcriptional errors that result from this process are unintentional.

## 2016-04-29 NOTE — Progress Notes (Signed)
PT Cancellation Note  Patient Details Name: Waylan BogaJanet M Life MRN: [id number] DOB: 02/03/1930   Cancelled Treatment:    Reason Eval/Treat Not Completed: Other (comment). Consult received and chart reviewed. Pt currently lethargic, sleeping with mouth open. Pt does not respond to verbal cues as she is very HOH. Sternal rubbing necessary to awaken patient and she is only able to briefly open eyes for a few seconds prior to falling back asleep. Covers removed, however pt still asleep. Unable to stay awake enough to communicate through white board. RN notified. Per RN, pt very active at baseline. Will re-attempt evaluation when pt more awake.    Imane Burrough 04/29/2016, 3:57 PM  Elizabeth PalauStephanie Zakaria Sedor, PT, DPT [phone]

## 2016-04-29 NOTE — Care Management Note (Signed)
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 Valerie's contact number is [phone]. Patient is from home with husband where she has become more difficult with her dementia. She has not had a shower in months per both.She refused to go to the doctor or dentist for treatment. [facility] is now following patient in the home setting. Patient has home O2 through [place] but "does not wear it". He PCP was Dr. Ernst Spell but she hasn't seen him in over a year. They have a bed hold for patient at [facility] but family would like to see if patient could go to SNF for rehab for a little while if appropriate.   Action/Plan:  Home health list left with husband. CSW updated.   Expected Discharge Date:                  Expected Discharge Plan:     In-House Referral:  Clinical Social Work  Discharge planning Services  CM Consult  Post Acute Care Choice:  Durable Medical Equipment, Home Health Choice offered to:  Spouse, Adult Children  DME Arranged:    DME Agency:     HH Arranged:    HH Agency:     Status of Service:  In process, will continue to follow  If discussed at Long Length of Stay Meetings, dates discussed:    Additional Comments:  Marshell Garfinkel, RN 04/29/2016, 1:39 PM

## 2016-04-29 NOTE — Progress Notes (Signed)
D Vachhani made aware of K+ 5.2, and R on T PVCs. Kayexalate ordered x 1.

## 2016-04-29 NOTE — Progress Notes (Signed)
This patient came from the ED on Bi-PAP. Patient has been asleep for the majority of the night, easy to arouse, confused. Per the pt's daughter this is all new for her in the past 24 hours. Pt will receive abx starting tomorrow, per pharmacy. Per daughter, hoping this will "turn her around". They do not want aggressive measures taken, "basic medical only." Family will return tomorrow to see this patient.

## 2016-04-29 NOTE — Progress Notes (Signed)
Pharmacy Antibiotic Note  Loretta Saunders is a 83 y.o. female admitted on 2016-05-11 with pneumonia.  Pharmacy has been consulted for ceftriaxone dosing. Patient is also ordered azithromycin.   Plan: Will continue ceftriaxone 1 gram q 24 hours.  Height: 5\' 4"  (162.6 cm) Weight: 79 lb 12.9 oz (36.2 kg) IBW/kg (Calculated) : 54.7  Temp (24hrs), Avg:97.7 F (36.5 C), Min:97.3 F (36.3 C), Max:98.2 F (36.8 C)   Recent Labs Lab March 20, 2016 1657 March 20, 2016 2037 04/29/16 0459 04/29/16 0846  WBC 6.6  --   --  6.8  CREATININE 0.66  --  0.56  --   LATICACIDVEN  --  1.3  --   --     Estimated Creatinine Clearance: 29.9 mL/min (by C-G formula based on SCr of 0.56 mg/dL).    No Known Allergies  Antimicrobials this admission: azithromycin ceftriaxone 2/22 >>   Dose adjustments this admission:  Microbiology results: 2/22 BCx: NGTD 2/22 MRSA PCR: negative  Thank you for allowing pharmacy to be a part of this patient's care.  Luisa HartChristy, Britne Borelli D 04/29/2016 1:55 PM

## 2016-04-30 ENCOUNTER — Encounter: Payer: Self-pay | Admitting: Internal Medicine

## 2016-04-30 LAB — BASIC METABOLIC PANEL
Anion gap: 6 (ref 5–15)
BUN: 34 mg/dL — AB (ref 6–20)
CALCIUM: 8.7 mg/dL — AB (ref 8.9–10.3)
CO2: 34 mmol/L — AB (ref 22–32)
CREATININE: 0.71 mg/dL (ref 0.44–1.00)
Chloride: 104 mmol/L (ref 101–111)
GFR calc non Af Amer: >60 mL/min (ref >60)
GLUCOSE: 133 mg/dL — AB (ref 65–99)
Potassium: 4 mmol/L (ref 3.5–5.1)
Sodium: 144 mmol/L (ref 135–145)

## 2016-04-30 LAB — INFLUENZA PANEL BY PCR (TYPE A & B)
Influenza A By PCR: NEGATIVE
Influenza B By PCR: NEGATIVE

## 2016-04-30 MED ORDER — QUETIAPINE FUMARATE 25 MG PO TABS: 25.0000 mg | ORAL_TABLET | Freq: Every day | ORAL | Status: DC

## 2016-04-30 MED ORDER — IPRATROPIUM-ALBUTEROL 0.5-2.5 (3) MG/3ML IN SOLN: 3.0000 mL | RESPIRATORY_TRACT | Status: DC | PRN

## 2016-04-30 MED ORDER — QUETIAPINE FUMARATE ER 50 MG PO TB24: 25.0000 mg | ORAL_TABLET | Freq: Every day | ORAL | Status: DC

## 2016-04-30 NOTE — NC FL2 (Signed)
[place] MEDICAID FL2 LEVEL OF CARE SCREENING TOOL     IDENTIFICATION  Patient Name: Loretta Saunders Birthdate: 09/03/1929 Sex: female Admission Date (Current Location): 04/14/2016  [place]   Facility and Address:  [facility], [address], [place], [place] [address]      Provider Number: [id number]  Attending Physician Name and Address:  Milagros LollSrikar Sudini, MD  Relative Name and Phone Number:       Current Level of Care: Hospital Recommended Level of Care: Assisted Living Facility Prior Approval Number:    Date Approved/Denied: 07/28/04 PASRR Number: [phone]  Discharge Plan: Other (Comment) (ALF )    Current Diagnoses: Patient Active Problem List   Diagnosis Date Noted  . Protein-calorie malnutrition, severe 04/29/2016  . Acute respiratory failure with hypoxia and hypercapnia (HCC) 04/25/2016  . CAP (community acquired pneumonia) 04/09/2016  . Dementia 04/11/2016  . COPD (chronic obstructive pulmonary disease) (HCC) 04/21/2016    Orientation RESPIRATION BLADDER Height & Weight     Self, Time  O2 (2L o2 acute/has o2 at home for PRN due to COPD) Continent Weight: 79 lb 12.9 oz (36.2 kg) Height:  5\' 4"  (162.6 cm)  BEHAVIORAL SYMPTOMS/MOOD NEUROLOGICAL BOWEL NUTRITION STATUS      Continent Supplemental  AMBULATORY STATUS COMMUNICATION OF NEEDS Skin   Extensive Assist Verbally Normal                       Personal Care Assistance Level of Assistance  Bathing, Feeding, Dressing Bathing Assistance: Limited assistance Feeding assistance: Independent Dressing Assistance: Limited assistance     Functional Limitations Info             SPECIAL CARE FACTORS FREQUENCY   (HHPT in facility TBD)                    Contractures Contractures Info: Present    Additional Factors Info  Code Status Code Status Info: DNR             Current Medications (04/30/2016):  This is the current  hospital active medication list Current Facility-Administered Medications  Medication Dose Route Frequency Provider Last Rate Last Dose  . acetaminophen (TYLENOL) tablet 650 mg  650 mg Oral Q6H PRN Oralia Manisavid Willis, MD       Or  . acetaminophen (TYLENOL) suppository 650 mg  650 mg Rectal Q6H PRN Oralia Manisavid Willis, MD      . azithromycin (ZITHROMAX) 500 mg in dextrose 5 % 250 mL IVPB  500 mg Intravenous Q24H Oralia Manisavid Willis, MD   500 mg at 04/29/16 2152  . budesonide (PULMICORT) nebulizer solution 0.5 mg  0.5 mg Nebulization BID Erin FullingKurian Kasa, MD   0.5 mg at 04/30/16 0853  . cefTRIAXone (ROCEPHIN) 1 g in dextrose 5 % 50 mL IVPB  1 g Intravenous Q24H Oralia Manisavid Willis, MD   1 g at 04/29/16 1825  . enoxaparin (LOVENOX) injection 30 mg  30 mg Subcutaneous Q24H Oralia Manisavid Willis, MD   30 mg at 04/29/16 2153  . feeding supplement (ENSURE ENLIVE) (ENSURE ENLIVE) liquid 237 mL  237 mL Oral BID BM Altamese DillingVaibhavkumar Vachhani, MD   237 mL at 04/29/16 1411  . ipratropium-albuterol (DUONEB) 0.5-2.5 (3) MG/3ML nebulizer solution 3 mL  3 mL Nebulization Q2H PRN Oralia Manisavid Willis, MD      . ipratropium-albuterol (DUONEB) 0.5-2.5 (3) MG/3ML nebulizer solution 3 mL  3 mL Nebulization Q4H PRN Milagros LollSrikar Sudini, MD      .  LORazepam (ATIVAN) 2 MG/ML injection           . methylPREDNISolone sodium succinate (SOLU-MEDROL) 40 mg/mL injection 20 mg  20 mg Intravenous Q12H Erin Fulling, MD   20 mg at 04/30/16 0039  . ondansetron (ZOFRAN) tablet 4 mg  4 mg Oral Q6H PRN Oralia Manis, MD       Or  . ondansetron [facility]) injection 4 mg  4 mg Intravenous Q6H PRN Oralia Manis, MD         Discharge Medications: Please see discharge summary for a list of discharge medications.  Relevant Imaging Results:  Relevant Lab Results:   Additional Information SS# [id number]  Judi Cong, LCSW

## 2016-04-30 NOTE — Plan of Care (Signed)
Problem: Spiritual Needs Goal: Ability to function at adequate level Outcome: Not Progressing Pt has goal of placement in facility after discharge due to severe dementia. Pt is unable to participate in her own care much of the time, is limited cognitively. Sitter at bedside due to poor impulse control and aggressive behavior.

## 2016-04-30 NOTE — Progress Notes (Signed)
[facility]         [place], [place]   04/30/2016  Patient: Loretta Saunders   Date of Birth:  07/09/1929  Date of admission:  11/08/16  Date of Discharge  04/30/2016    To Whom it May Concern:   Loretta Saunders  Is under my care at [facility]. She has a diagnosis of advanced dementia and at this time is unable to make her own decisions.  If you have any questions or concerns, please don't hesitate to call.  Sincerely,   Milagros LollSudini, Dari Carpenito R M.D Office : [phone]   .

## 2016-04-30 NOTE — Progress Notes (Signed)
Dr. Elpidio Anissudini paged to notify that ABG results were available. Dr. Elpidio Anissudini checked results and callled this Clinical research associatewriter. thsi Clinical research associatewriter explained that pt woke briefly with daughter at bedside, enough to refuse her dinner. Received order for seroquel 25mg  po qhs.

## 2016-04-30 NOTE — Progress Notes (Signed)
PT Cancellation Note  Patient Details Name: Loretta BogaJanet M Rutigliano MRN: [id number] DOB: 04/17/1929   Cancelled Treatment:    Reason Eval/Treat Not Completed: Fatigue/lethargy limiting ability to participate. Chart reviewed. Attempted to see patient however unable to arouse pt with verbal and tactile attempts. Pt will not open eyes and only mumbles very softly. Pt unable to participate with PT evaluation at this time. Will attempt PT evaluation at later date/time as pt is able to participate.   Sharalyn InkJason D Jaimes Eckert PT, DPT   Elyza Whitt 04/30/2016, 2:09 PM

## 2016-04-30 NOTE — Progress Notes (Signed)
Pt's daughter at bedside stated that her brother wants to talk to dr regarding comfort care for this patient. This Clinical research associatewriter explained that this will change plan of care for pt, and while pt can still be treated for illness, primary focus will be comfort for pt. This Clinical research associatewriter then text paged dr. Elpidio Anissudini to let him know family would like to discuss this tomorrow. Pt has been changed for incontinence of stool and urine. Was able to be verbally redirected from interfering in this, pt then returned to sleep. o[facility] at [facility].

## 2016-04-30 NOTE — Progress Notes (Signed)
Pt has continued to sleep this shift, but is becoming more active in her sleep, moving around sideways in the bed, etc. Attempts made to reposition pt but pt will move back to her preferred position after this. Sitter at bedside.

## 2016-04-30 NOTE — Progress Notes (Signed)
This Clinical research associatewriter paged Dr.Sudini at approx 1500, related to him that pt is still sleeping. Discussed that PT had attempted to see pt twice and pt did not wake enough for eval.aslo, son is present and states that sleeping for long periods is not pt's baseline. This Clinical research associatewriter received verbal order for ABG. This Clinical research associatewriter then called respiratory therapy and communicated the order.Ophelia Charter. Sitter remains at bedside.

## 2016-04-30 NOTE — Progress Notes (Signed)
This Clinical research associatewriter had another conversation with pt's son and daughter at bedside. Per son, family is interested in comfort care, son asked this writer to stop antibiotics for pt. Pt has advanced dementia and unable to make decisions, this writer stopped antibiotic at family request. This Clinical research associatewriter urged family to speak to doctor in the am regarding comfort care/plan of care for pt. Also, may generate palliative care consult and further conversation with social worker regarding placement of pt in facility. Son stated that pt's spouse does not want her to die at home.

## 2016-04-30 NOTE — Progress Notes (Signed)
Shift assessment completedat 0800. Pt is resting in bed with eyes closed, o2 in place at [facility], lungs are decreased bilat. Pt stirs to touch but did not open her eyes. Hr is regular, abdomen is soft, bs heard. Pt has foam dressings intact to bilat hips and sacrum,ppp, no edema noted. PIV #20 intact to lac, site is free of redness and swelling. Since assessment, pt has had flu panel sent to lab. Son has arrived and been updated by this Clinical research associatewriter and Dr.Sudini, and has also spoken to Child psychotherapistsocial worker Clydie BraunKaren. Per son, pt has a bed in a memory care unit and was supposed to arrive there yesterday, but has been here in the hospital instead. Pt's husband has been taking care of her at home and is 6989, not able to do this anymore. Sitter at bedside.

## 2016-04-30 NOTE — Progress Notes (Signed)
[employer] - [place] at [facility]   PATIENT NAME: Loretta Saunders    MR#:  [id number]  DATE OF BIRTH:  [birth year]  SUBJECTIVE:  CHIEF COMPLAINT:   Chief Complaint  Patient presents with  . Weakness  . Cough   Baseline dementia, brought with lethargy- found to have PNA and was on bipap initially.  On 3 L O2. Drowzy after receiving ativan and haldol overnight when she was combative . Son at bedside  REVIEW OF SYSTEMS:  Pt is demented, not able to give ROS.   ROS  DRUG ALLERGIES:  No Known Allergies  VITALS:  Blood pressure (!) 122/57, pulse 70, temperature (!) 96.1 F (35.6 C), temperature source Axillary, resp. rate 18, height 5\' 4"  (1.626 m), weight 36.2 kg (79 lb 12.9 oz), SpO2 100 %.  PHYSICAL EXAMINATION:  Constitutional: She appears well-developed and well-nourished. No distress.  HENT:  Head: Normocephalic and atraumatic.  Mouth/Throat: Oropharynx is clear and moist.  Eyes: Conjunctivae and EOM are normal. Pupils are equal, round, and reactive to light. No scleral icterus.  Neck: Normal range of motion. Neck supple. No JVD present. No thyromegaly present.  Cardiovascular: Normal rate, regular rhythm and intact distal pulses.  Exam reveals no gallop and no friction rub.   No murmur heard. Respiratory: She is in respiratory distress. She has no wheezes. She has no rales.  Coarse breath sounds  GI: Soft. Bowel sounds are normal. She exhibits no distension. There is no tenderness.  Musculoskeletal: Normal range of motion. She exhibits no edema.  No arthritis, no gout  Lymphadenopathy:    She has no cervical adenopathy.  Neurological:  Unable to assess due to patient condition and dementia  Skin: Skin is warm and dry. No rash noted. No erythema.  Psychiatric:  Unable to assess due to patient condition and dementia  SKIN: No obvious rash, lesion, or ulcer.   Physical Exam LABORATORY PANEL:   CBC  Recent Labs Lab 04/29/16 0846  WBC 6.8   HGB 13.3  HCT 39.6  PLT 190   ------------------------------------------------------------------------------------------------------------------  Chemistries   Recent Labs Lab 04/30/16 0614  NA 144  K 4.0  CL 104  CO2 34*  GLUCOSE 133*  BUN 34*  CREATININE 0.71  CALCIUM 8.7*   ------------------------------------------------------------------------------------------------------------------  Cardiac Enzymes  Recent Labs Lab 04/09/2016 1657  TROPONINI <0.03   ------------------------------------------------------------------------------------------------------------------  RADIOLOGY:  Dg Chest 2 View  Result Date: 04/20/2016 CLINICAL DATA:  Strong productive cough for three years, history COPD, dementia, former smoker EXAM: CHEST  2 VIEW COMPARISON:  CT chest 12/31/2012 FINDINGS: Mild enlargement of cardiac silhouette with minimal pulmonary vascular congestion. Atherosclerotic calcification aorta. Emphysematous and bronchitic changes consistent with COPD. RIGHT apical scarring. Poorly defined opacity in the RIGHT mid lung, question infiltrate, mass not excluded. Remaining lungs clear. No pleural effusion or pneumothorax. Bones diffusely demineralized. IMPRESSION: COPD changes with questionable infiltrate versus mass in the RIGHT mid lung, not definitely localized on lateral view, recommend further assessment by CT imaging to exclude tumor. Mild enlargement of cardiac silhouette with aortic atherosclerosis. Electronically Signed   By: Ulyses Southward M.D.   On: 04/24/2016 17:56   Ct Chest W Contrast  Result Date: 05/01/2016 CLINICAL DATA:  Weakness and cough. EXAM: CT CHEST WITH CONTRAST TECHNIQUE: Multidetector CT imaging of the chest was performed during intravenous contrast administration. CONTRAST:  75mL ISOVUE-300 IOPAMIDOL (ISOVUE-300) INJECTION 61% COMPARISON:  12/31/2012 FINDINGS: Cardiovascular: Heart size upper normal. Coronary artery calcification is noted. Atherosclerotic  calcification is noted  in the wall of the thoracic aorta. No large central pulmonary embolus. Mediastinum/Nodes: No mediastinal lymphadenopathy. There is no hilar lymphadenopathy. The esophagus has normal imaging features. There is no axillary lymphadenopathy. Lungs/Pleura: Biapical pleural-parenchymal scarring again noted. Central right upper lobe atelectasis with associated airway impaction is more prominent than previous. Numerous other areas of small airway impaction are seen in the lungs bilaterally. Scattered areas of peribronchovascular nodularity are seen in the bilateral upper lobes, lingula, and both lower lobes. There is collapse/consolidation in the right middle lobe, new in the interval. Prominent bronchial wall thickening identified bilaterally. No overtly suspicious pulmonary nodule or mass. Upper Abdomen: Heterogeneous enhancement left liver with potential area of biliary dilatation. Musculoskeletal: Old sternal fracture noted. Bone windows reveal no worrisome lytic or sclerotic osseous lesions. IMPRESSION: 1. Numerous areas of peribronchovascular nodularity distributed through both lungs. This is associated with widespread atelectasis, bronchial wall thickening, and scattered areas of small airway impaction. Imaging features are compatible with chronic atypical lung infection. 2. Interval development of right middle lobe collapse/consolidation. This may be postobstructive related to the atypical infection. Broncho pneumonia not entirely excluded. 3.  Emphysema. ([id number](ICD10-J43.9) Electronically Signed   By: Kennith CenterEric  Mansell M.D.   On: 2016-11-23 19:18    ASSESSMENT AND PLAN:   Principal Problem:   Acute respiratory failure with hypoxia and hypercapnia (HCC) Active Problems:   CAP (community acquired pneumonia)   Dementia   COPD (chronic obstructive pulmonary disease) (HCC)   Protein-calorie malnutrition, severe   * Acute respiratory failure with hypoxia due to bilateral Community acquired  pneumonia.  ON iV abx  * COPD exacerbation -IV steroids, Antibiotics - Scheduled Nebulizers - Inhalers -Wean O2 as tolerated - Consult pulmonary if no improvement  * Acute toxic encephalopathy over dementia due to inpatient delirium and acute illness  * Hyperkalemia   Given oral kayexalate Improved  All the records are reviewed and case discussed with Care Management/Social Worker Management plans discussed with the patient, family and they are in agreement.  CODE STATUS: DNR  TOTAL TIME TAKING CARE OF THIS PATIENT: 35 minutes.   POSSIBLE D/C IN 1-2 DAYS, DEPENDING ON CLINICAL CONDITION.  Milagros LollSudini, Rosario Kushner R M.D on 04/30/2016   Between 7am to 6pm - Pager - [phone]  After 6pm go to [email or web address] - Social research officer, governmentpassword EPAS [facility]  [facility] [place] [facility]  Office  [phone]  CC: Primary care physician; PROVIDER NOT IN SYSTEM  Note: This dictation was prepared with Dragon dictation along with smaller phrase technology. Any transcriptional errors that result from this process are unintentional.

## 2016-04-30 NOTE — Progress Notes (Signed)
Patient was being transferred to room 136. Report had been called to Stanton Kidneyebra, Charity fundraiserN. Patient was transferred to room 136 bed in ICU room 6. Patient transfer began to room 136 w/ sitter and orderly. In the main ICU hallway, the patient began climbing out of the bed, screaming, grabbing at staff members. Patient was assisted back into the bed, w/ much resistance from her. She was brought back to ICU room 6. She continued to attempt to climb out of the bed, screaming profanity, swinging at and grabbing staff. Dr. Anne HahnWillis was paged, situation explained to him, gave order for 5mg  Haldol x 1. Same was administered. In 25 minutes, no relief was noted - still multiple staff members having to keep patient in bed. Dr. Joice LoftsWaillis notified of same, received order for 1mg  Lorazepam x 1. Same was administered. In approximately 30 minutes, the patient began to settle. At the time of this note, the patient is sleeping lightly, while having to be reassured and reoriented at times. Sitter at the bedside. The patient will remain here for observation until she is in a more suitable condition for transfer to room 136. Stanton Kidneyebra, RN notified of the situation. Will continue to monitor.

## 2016-04-30 NOTE — Progress Notes (Signed)
This patient has been asleep and calm for approximately 4 hours now (since Haldol and lorazepam). I spoke w/ Dr. Sheryle Hailiamond who advised to d/c continuous cardiac monitoring orders. I spoke w/ Stanton Kidneyebra, RN for room 136, updated her from earlier report. She advised that I could go ahead and bring the patient to room 136. Sitter will travel w/ the patient and this Clinical research associatewriter.

## 2016-04-30 NOTE — Clinical Social Work Note (Signed)
Clinical Social Work Assessment  Patient Details  Name: Loretta Saunders MRN: [id number] Date of Birth: 1929-06-21  Date of referral:  04/30/16               Reason for consult:  Facility Placement                Permission sought to share information with:  Chartered certified accountant granted to share information::  Yes, Verbal Permission Granted  Name::        Agency::  [facility]  Relationship::     Contact Information:     Housing/Transportation Living arrangements for the past 2 months:  Single Family Home Source of Information:  Adult Children Patient Interpreter Needed:  None Criminal Activity/Legal Involvement Pertinent to Current Situation/Hospitalization:  No - Comment as needed Significant Relationships:  Adult Children Lives with:  Spouse Do you feel safe going back to the place where you live?  Yes Need for family participation in patient care:  Yes (Comment) (The patient has dementia.)  Care giving concerns:  Patient has a bed at [facility]; SNF v HH at the ALF   Social Worker assessment / plan:  CSW met with the patient and her son, Loretta Saunders, at bedside to discuss dc planning. The patient's son is a former PT for [employer] [facility] [employer], and reports being open to suggestions from [place] concerning plan of care. The patient was slated to go to [facility] when she became ill with SOB and lethargy that was not typical. According to her son, the patient is typically high functioning and able to ambulate independently and perform all ADLs. The patient's son is concerned about her ambulation changes, and he would like his mother to have PT of some sort at dc. The CSW and the patient's son discussed the need for the sitter to be dc for at least 24 hours prior to any dc to SNF as well as the recommendation with a patient who has dementia to limit the frequency of transfers from one facility to another. The patient's son is in agreement, and he is willing to consider HHPT at the  ALF rather than SNF placement due to this recommendation so that his mother has a chance to become familiar with her new environment at [facility] rather than risk further agitation or decompensation with changing facilities.  PT recommendation is pending due to the patient's inability to participate at this time. The attending indicates dc will not be any earlier than Monday, 2/26, and he is in agreement with the plan to dc to ALF with HHPT rather than SNF due to the cognitive barriers.  Employment status:  Retired Forensic scientist:  Medicare PT Recommendations:  Not assessed at this time Information / Referral to community resources:     Patient/Family's Response to care:  The patient's son thanked the CSW and is very involved with her care.  Patient/Family's Understanding of and Emotional Response to Diagnosis, Current Treatment, and Prognosis:  The patient's son is understanding of the barriers to care and recommended dc plan. The patient's son has realistic expectations for the plan.  Emotional Assessment Appearance:  Appears stated age Attitude/Demeanor/Rapport:  Lethargic Affect (typically observed):  Withdrawn Orientation:    Alcohol / Substance use:  Never Used Psych involvement (Current and /or in the community):  Yes (Comment) (Patient has sitter due to dementia related behaviors.)  Discharge Needs  Concerns to be addressed:    Readmission within the last 30 days:  No Current discharge risk:  Cognitively Impaired Barriers to Discharge:  Continued Medical Work up, Requiring sitter/restraints, Other (Waiting for PT recommendation)   Zettie Pho, LCSW 04/30/2016, 10:34 AM

## 2016-05-01 LAB — BLOOD GAS, ARTERIAL
Acid-Base Excess: 14.6 mmol/L — ABNORMAL HIGH (ref 0.0–2.0)
Bicarbonate: 41.8 mmol/L — ABNORMAL HIGH (ref 20.0–28.0)
FIO2: 0.28
O2 Saturation: 96.2 %
Patient temperature: 37
pCO2 arterial: 63 mmHg — ABNORMAL HIGH (ref 32.0–48.0)
pH, Arterial: 7.43 (ref 7.350–7.450)
pO2, Arterial: 81 mmHg — ABNORMAL LOW (ref 83.0–108.0)

## 2016-05-01 MED ORDER — SODIUM CHLORIDE 0.9 % IV SOLN: 250.0000 mL | INTRAVENOUS | Status: DC | PRN

## 2016-05-01 MED ORDER — SODIUM CHLORIDE 0.9% FLUSH
3.0000 mL | Freq: Two times a day (BID) | INTRAVENOUS | Status: DC
  Administered 2016-05-01: 3 mL via INTRAVENOUS

## 2016-05-01 MED ORDER — LORAZEPAM 1 MG PO TABS: 1.0000 mg | ORAL_TABLET | ORAL | Status: DC | PRN

## 2016-05-01 MED ORDER — LORAZEPAM 2 MG/ML IJ SOLN
1.0000 mg | INTRAMUSCULAR | Status: DC | PRN
Start: 2016-05-01 — End: 2016-05-02
  Filled 2016-05-01: qty 1

## 2016-05-01 MED ORDER — LORAZEPAM 2 MG/ML PO CONC
1.0000 mg | ORAL | Status: DC | PRN
Start: 2016-05-01 — End: 2016-05-02
  Administered 2016-05-01: 1 mg via SUBLINGUAL
  Filled 2016-05-01: qty 1

## 2016-05-01 MED ORDER — MORPHINE SULFATE (CONCENTRATE) 10 MG/0.5ML PO SOLN
10.0000 mg | ORAL | Status: DC | PRN
  Administered 2016-05-01 – 2016-05-04 (×8): 10 mg via SUBLINGUAL
  Filled 2016-05-01 (×5): qty 1

## 2016-05-01 MED ORDER — MORPHINE SULFATE (PF) 2 MG/ML IV SOLN: 2.0000 mg | INTRAVENOUS | Status: DC | PRN

## 2016-05-01 MED ORDER — MORPHINE SULFATE (CONCENTRATE) 10 MG/0.5ML PO SOLN
10.0000 mg | ORAL | Status: DC | PRN
  Administered 2016-05-02: 10 mg via ORAL
  Filled 2016-05-01 (×4): qty 1

## 2016-05-01 MED ORDER — MORPHINE SULFATE (CONCENTRATE) 10 MG/0.5ML PO SOLN
5.0000 mg | ORAL | Status: DC | PRN
  Administered 2016-05-01: 5 mg via SUBLINGUAL
  Filled 2016-05-01: qty 1

## 2016-05-01 MED ORDER — SODIUM CHLORIDE 0.9% FLUSH: 3.0000 mL | INTRAVENOUS | Status: DC | PRN

## 2016-05-01 MED ORDER — MORPHINE SULFATE (CONCENTRATE) 10 MG/0.5ML PO SOLN: 5.0000 mg | ORAL | Status: DC | PRN

## 2016-05-01 NOTE — Progress Notes (Signed)
Pt family called out and they state that pt is still not resting comfortably. She no longer has IV access, and it is not yet time to administer additional oral medication. Dr. Elpidio AnisSudini notified. Per his order will increase PO morphine to 10mg  PO Q1 PRN.

## 2016-05-01 NOTE — Progress Notes (Signed)
[employer] - [place] at [facility]   PATIENT NAME: Loretta ParmaJanet Saunders    MR#:  [id number]  DATE OF BIRTH:  05/02/1929  SUBJECTIVE:  CHIEF COMPLAINT:   Chief Complaint  Patient presents with  . Weakness  . Cough   Baseline dementia, brought with lethargy- found to have PNA and was on bipap initially.  Continues to be on 3 L oxygen. Lethargic. Opens eyes. Family at bedside.  Ate a small piece of banana and some milkshake earlier.  REVIEW OF SYSTEMS:  Pt is demented, not able to give ROS.   ROS  DRUG ALLERGIES:  No Known Allergies  VITALS:  Blood pressure 111/61, pulse 69, temperature 98.2 F (36.8 C), temperature source Oral, resp. rate 20, height 5\' 4"  (1.626 m), weight 36.2 kg (79 lb 12.9 oz), SpO2 98 %.  PHYSICAL EXAMINATION:  Constitutional: Laying in bed. Restless Head: Normocephalic and atraumatic.  Mouth/Throat: Oropharynx is clear and moist.  Eyes: Conjunctivae and EOM are normal. Pupils are equal, round, and reactive to light. No scleral icterus.  Neck: Normal range of motion. Neck supple. No JVD present. No thyromegaly present.  Cardiovascular: Normal rate, regular rhythm and intact distal pulses.  Exam reveals no gallop and no friction rub.   No murmur heard. Respiratory: She is in respiratory distress. She has no wheezes. She has no rales.  Coarse breath sounds  GI: Soft. Bowel sounds are normal. She exhibits no distension. There is no tenderness.  Musculoskeletal: Normal range of motion. She exhibits no edema.  No arthritis, no gout  Lymphadenopathy:    She has no cervical adenopathy.  Neurological:  Unable to assess due to patient condition and dementia  Skin: Skin is warm and dry. No rash noted. No erythema.  Psychiatric:  Unable to assess due to patient condition and dementia  SKIN: No obvious rash, lesion, or ulcer.   Physical Exam LABORATORY PANEL:   CBC  Recent Labs Lab 04/29/16 0846  WBC 6.8  HGB 13.3  HCT 39.6  PLT 190    ------------------------------------------------------------------------------------------------------------------  Chemistries   Recent Labs Lab 04/30/16 0614  NA 144  K 4.0  CL 104  CO2 34*  GLUCOSE 133*  BUN 34*  CREATININE 0.71  CALCIUM 8.7*   ------------------------------------------------------------------------------------------------------------------  Cardiac Enzymes  Recent Labs Lab 04-15-2016 1657  TROPONINI <0.03   ------------------------------------------------------------------------------------------------------------------  RADIOLOGY:  No results found.  ASSESSMENT AND PLAN:   Principal Problem:   Acute respiratory failure with hypoxia and hypercapnia (HCC) Active Problems:   CAP (community acquired pneumonia)   Dementia   COPD (chronic obstructive pulmonary disease) (HCC)   Protein-calorie malnutrition, severe   * Acute respiratory failure with hypoxia due to bilateral Community acquired pneumonia. No significant improvement   iV abx  * COPD exacerbation -IV steroids, Antibiotics - Scheduled Nebulizers - Inhalers -Wean O2 as tolerated  * Acute toxic encephalopathy over dementia due to inpatient delirium and acute illness  * Hyperkalemia  Resolved  All the records are reviewed and case discussed with Care Management/Social Worker Management plans discussed with the patient, family and they are in agreement.  CODE STATUS: DNR  TOTAL TIME TAKING CARE OF THIS PATIENT: 35 minutes.   POSSIBLE D/C IN 2-3 DAYS, DEPENDING ON CLINICAL CONDITION.  Milagros LollSudini, Dryden Tapley R M.D on 05/01/2016   Between 7am to 6pm - Pager - [phone]  After 6pm go to [email or web address] - password [employer] HomesEPAS [email or web address]  [facility] [place] [facility]  Office  [phone]  CC: Primary care physician; PROVIDER NOT IN  SYSTEM  Note: This dictation was prepared with Dragon dictation along with smaller phrase technology. Any transcriptional errors that result from this process are  unintentional.

## 2016-05-01 NOTE — Progress Notes (Signed)
Advance care planning  Met with son and daughter at bedside. Patient continues to be drowsy. She has had progressively worsening dementia over the past 14 years. Poor appetite at this time. She is on 3 status oxygen with bilateral pneumonia. Overall seems to have poor prognosis due to poor nutrition, pneumonia and worsening dementia.  On discussing further family has expressed wishes for patient to be comfortable. They wanted her IV antibiotics stopped. We'll place on full comfort measures with as needed morphine and Ativan with no lab draws or new IV lines. Family agrees.  Discussed with nursing staff. Orders entered. Patient is DO NOT RESUSCITATE.  Time spent 20 minutes

## 2016-05-01 NOTE — Clinical Social Work Note (Signed)
CSW met with the patient and her daughter at bedside to discuss dc planning with regards to comfort care measures. The family is in favor of hospice care, and they are willing to follow any recommendations with regards to home hospice at [facility]. Palliative care consult has been placed. CSW will con't to follow for dc planning, end of life goal setting and emotional support.   Martha , MSW, LCSWA [phone] 

## 2016-05-01 NOTE — Progress Notes (Signed)
I spoke with Dr. Elpidio AnisSudini about use of telesitter as we do not have available staff on the floor to act as sitter today. He stated use of a telesitter would be fine. Pt's family is currently at bedside. They would rather wait for telesitter to be initiated later tonight when they are not there. They are agreeable to staying in the room with the patient through the day, and will tell staff if they are to leave.

## 2016-05-01 NOTE — Progress Notes (Signed)
PT Cancellation Note  Patient Details Name: Loretta Saunders MRN: [id number] DOB: 02/14/1930   Cancelled Treatment:    Reason Eval/Treat Not Completed: Other (comment). Chart reviewed and per RN notes family is wishing to move patient toward comfort care measures. Spoke with family in presence of patient and they decline any further attempts at PT intervention. They do not wish for a PT evaluation. Family encouraged to request a PT consult if status or needs change. Will complete current order.  Sharalyn InkJason D Amauris Debois PT, DPT   Loretta Saunders 05/01/2016, 10:03 AM

## 2016-05-02 MED ORDER — LORAZEPAM 2 MG/ML IJ SOLN: 1.0000 mg | INTRAMUSCULAR | Status: DC | PRN

## 2016-05-02 MED ORDER — LORAZEPAM 2 MG PO TABS: 2.0000 mg | ORAL_TABLET | ORAL | Status: DC | PRN

## 2016-05-02 MED ORDER — LORAZEPAM 2 MG/ML PO CONC
2.0000 mg | ORAL | Status: DC | PRN
  Administered 2016-05-02: 2 mg via SUBLINGUAL
  Filled 2016-05-02: qty 1

## 2016-05-02 NOTE — Progress Notes (Signed)
Per RN in progression rounds patient is on comfort care. Clinical Education officer, museum (CSW) met with patient's family at bedside, which included daughters Jeani Hawking and Mateo Flow, husband Yvone Neu, son in Sports coach and granddaughter. Per family they are leaning towards hospice home in [place]. CSW explained that palliative NP will meet with family and discuss the options and determine if patient meets criteria for the hospice home. Family is aware that patient requires a 2 week or less prognosis to be eligible for hospice home. CSW provided emotional support to family. The family's goal is to make sure patient is comfortable. CSW will continue to follow and assist as needed.   [employer], LCSW [phone]

## 2016-05-02 NOTE — Progress Notes (Signed)
New hospice home referral received from [facility]. Mrs. Coriz is an 83 year old woman with  a PMH of COPD, asthma and dementia admitted from home on 2/22 for evaluation of increased weakness and cough. She was found on chest xray to have pneumonia and required Bipap for acuter respiratory failure. She has been treated with IV antibiotics, fluids and breathing treatments, she has however continued to decline. Family chose to focus on comfort with transfer to the hospice home when bed is available.  Patient seen lying in bed, oxygen in place at 2 liters via nasal cannula. Patient appears cachectic, did not respond to verbal stimuli, family at bedside.  Writer met in the family room with patient's husband Yvone Neu, and daughter's Mateo Flow and Jeani Hawking to initiate education regarding hospice services, philosophy and team approach to care with good understanding voiced. Questions answered, consents signed. Patient information faxed to hospice referral. Family and hospital care team all aware that there is no bed available for transport today. Writer will continue to follow and update family and hospital care team. Thank you. Flo Shanks RN, BSN, [place] [facility] [place], [facility] [phone] c

## 2016-05-02 NOTE — Progress Notes (Signed)
Mouth care provided to patient at this time. Family at bedside. No questions or concerns from family at this time. Will continue to monitor.   Suzan SlickAlison L Markez [place], RN

## 2016-05-02 NOTE — Progress Notes (Signed)
Pt care done. Pt appeared very uncomfortable grimmacing and sitting straight up with arms behind back and labored breathing. Adm morphine as ordered, will cont to monitor

## 2016-05-02 NOTE — Clinical Social Work Note (Signed)
Misty [facility]Lisa [facility] called CSW and CSW provided an update from the weekend events. It appears as though family is contemplating [facility] hosp[facility] home. Patient appears somewhat symptomatic with grimmacing this morning. Misty StanleyLisa stated that patient's husband had already moved patient's belongings into their facility and that she was slated to admit to their facility but then patient had to be hospitalized. Misty StanleyLisa stated that they more than likely can still take patient with hospice but she is going to confer with her supervisor.  York SpanielMonica Kutter Schnepf MSW,LCSW [phone]

## 2016-05-02 NOTE — Progress Notes (Signed)
Patient noted to have periods of labored breathing and use of accessory muscles for breathing. PRN medication administered at this time. Family at bedside. Will continue to monitor.   Suzan SlickAlison L Vernie Vinciguerra, RN

## 2016-05-02 NOTE — Progress Notes (Signed)
Tele-sitter discontinued by MD.  Loretta Saunders

## 2016-05-02 NOTE — Progress Notes (Signed)
Attending MD asked Clinical Social Worker (CSW) to make referral to the hospice home and stated that patient meets criteria. [facility] and made her aware of above. CSW will continue to follow and assist as needed.   Baker Hughes IncorporatedBailey Ravan Schlemmer, LCSW [phone]

## 2016-05-02 NOTE — Progress Notes (Signed)
[facility] - [place] at [facility]   PATIENT NAME: Loretta Saunders    MR#:  [id number]  DATE OF BIRTH:  01/19/1930  SUBJECTIVE:  CHIEF COMPLAINT:   Chief Complaint  Patient presents with  . Weakness  . Cough   Laying in bed. Eyes closed.  Needing Morphine PRN  REVIEW OF SYSTEMS:    Review of Systems  Unable to perform ROS: Mental status change    DRUG ALLERGIES:  No Known Allergies  VITALS:  Blood pressure 111/61, pulse 69, temperature 98.2 F (36.8 C), temperature source Oral, resp. rate 20, height 5\' 4"  (1.626 m), weight 36.2 kg (79 lb 12.9 oz), SpO2 98 %.  PHYSICAL EXAMINATION:   Physical Exam  GENERAL:  83 y.o.-year-old patient lying in the bed EYES: closed HEENT: Head atraumatic, normocephalic. EXTREMITIES: No cyanosis, clubbing or edema b/l.    PSYCHIATRIC: Drowzy, shallow breathing  LABORATORY PANEL:   CBC  Recent Labs Lab 04/29/16 0846  WBC 6.8  HGB 13.3  HCT 39.6  PLT 190   ------------------------------------------------------------------------------------------------------------------ Chemistries   Recent Labs Lab 04/30/16 0614  NA 144  K 4.0  CL 104  CO2 34*  GLUCOSE 133*  BUN 34*  CREATININE 0.71  CALCIUM 8.7*   ------------------------------------------------------------------------------------------------------------------  Cardiac Enzymes  Recent Labs Lab 04/20/2016 1657  TROPONINI <0.03   ------------------------------------------------------------------------------------------------------------------  RADIOLOGY:  No results found.   ASSESSMENT AND PLAN:   * Acute respiratory failure with hypoxia due to bilateral Community acquired pneumonia. * COPD exacerbation * Acute toxic encephalopathy over dementia due to inpatient delirium and acute illness * Hyperkalemia  Now on comfort measures due to worsening as per family request.  Discussed with Husband, daughter at bedside.  Waiting for Hospice  home. May have bed tomorrow  Morphine and Ativan PRN  All the records are reviewed and case discussed with Care Management/Social Workerr. Management plans discussed with the patient, family and they are in agreement.  CODE STATUS: DNR  DVT Prophylaxis: SCDs  TOTAL TIME TAKING CARE OF THIS PATIENT: 30 minutes.   POSSIBLE D/C IN 1-2 DAYS, DEPENDING ON CLINICAL CONDITION.  Milagros LollSudini, Calysta Craigo R M.D on 05/02/2016 at 11:56 AM  Between 7am to 6pm - Pager - [phone]  After 6pm go to [email or web address] - password [email or web address]  [facility] [place] [facility]  Office  [phone]  CC: Primary care physician; PROVIDER NOT IN SYSTEM  Note: This dictation was prepared with Dragon dictation along with smaller phrase technology. Any transcriptional errors that result from this process are unintentional.

## 2016-05-03 LAB — CULTURE, BLOOD (ROUTINE X 2)
Culture: NO GROWTH
Culture: NO GROWTH

## 2016-05-03 NOTE — Progress Notes (Signed)
While rounding the unit the Nurse asked the [facility] to visit with the family of the Pt who is actively dying. [place] visited and met with the Pt's daughter-in-Law who showed the [facility] pictures of the Pt's children and family. She also spoke about the Pt's [place], how she raised her 6 children, and the struggles she had with poor health. Pt's daughter-in-law had a positive reflection on the Pt's life and the impact she made on her family, which she stated will always be remembered. [place] asked the daughter-in-law if she needed anything, she said not, but requested prayers, which the [facility] provided for the Pt and the family. [place] informed the daughter-in-law that the [facility] was available if needed.     05/03/16 1500  Clinical Encounter Type  Visited With Patient;Patient and family together  Visit Type Follow-up;Spiritual support;Patient actively dying  Referral From Nurse  Consult/Referral To Chaplain  Spiritual Encounters  Spiritual Needs Prayer;Emotional;Other (Comment)

## 2016-05-03 NOTE — Progress Notes (Signed)
[facility] - [place] at [facility]   PATIENT NAME: Loretta Saunders    MR#:  [id number]  DATE OF BIRTH:  11/26/1929  SUBJECTIVE:  CHIEF COMPLAINT:   Chief Complaint  Patient presents with  . Weakness  . Cough   Laying in bed. Eyes closed.  Daughter at bedside  REVIEW OF SYSTEMS:    Review of Systems  Unable to perform ROS: Mental status change    DRUG ALLERGIES:  No Known Allergies  VITALS:  Blood pressure (!) 97/49, pulse (!) 153, temperature 97.4 F (36.3 C), temperature source Oral, resp. rate 14, height 5\' 4"  (1.626 m), weight 36.2 kg (79 lb 12.9 oz), SpO2 (!) 77 %.  PHYSICAL EXAMINATION:   Physical Exam  GENERAL:  83 y.o.-year-old patient lying in the bed EYES: closed HEENT: Head atraumatic, normocephalic. EXTREMITIES: Cyanosis PSYCHIATRIC: Drowzy, shallow breathing  LABORATORY PANEL:   CBC  Recent Labs Lab 04/29/16 0846  WBC 6.8  HGB 13.3  HCT 39.6  PLT 190   ------------------------------------------------------------------------------------------------------------------ Chemistries   Recent Labs Lab 04/30/16 0614  NA 144  K 4.0  CL 104  CO2 34*  GLUCOSE 133*  BUN 34*  CREATININE 0.71  CALCIUM 8.7*   ------------------------------------------------------------------------------------------------------------------  Cardiac Enzymes  Recent Labs Lab Aug 05, 2016 1657  TROPONINI <0.03   ------------------------------------------------------------------------------------------------------------------  RADIOLOGY:  No results found.   ASSESSMENT AND PLAN:   * Acute respiratory failure with hypoxia due to bilateral Community acquired pneumonia. * COPD exacerbation * Acute toxic encephalopathy over dementia due to inpatient delirium and acute illness * Hyperkalemia  Now on comfort measures due to worsening as per family request.  Discussed with daughter at bedside.  Patient is cyanotic with shallow breathing. His  end-of-life and may have only a few hours at this time. Unstable for transfer to hospice home. We'll continue end-of-life care in the hospital.  Morphine and Ativan PRN  All the records are reviewed and case discussed with Care Management/Social Workerr. Management plans discussed with the patient, family and they are in agreement.  CODE STATUS: DNR  DVT Prophylaxis: SCDs  TOTAL TIME TAKING CARE OF THIS PATIENT: 25 minutes. Greater than 50% time spent in coordinating care and discussing with family at bedside.   POSSIBLE D/C IN 1-2 DAYS, DEPENDING ON CLINICAL CONDITION.  Milagros LollSudini, Keerthana Vanrossum R M.D on 05/03/2016 at 11:17 AM  Between 7am to 6pm - Pager - [phone]  After 6pm go to [email or web address] - password [email or web address]  [facility]OUND  Hospitalists  Office  [phone]  CC: Primary care physician; PROVIDER NOT IN SYSTEM  Note: This dictation was prepared with Dragon dictation along with smaller phrase technology. Any transcriptional errors that result from this process are unintentional.

## 2016-05-03 NOTE — Progress Notes (Signed)
Follow up visit made to new Hospice home referral. Patient seen lying in bed, respirations shallow and and infrequent. Daughter Vikki PortsValerie at bedside. She has noticed the further decline overnight in her mother. Mottling noted to bilateral knees, hands cyanotic. Attending physician Dr. Elpidio AnisSudini in during visit. Mrs. Roxan HockeyRobinson [facility] at [facility] and not transfer to the Hospice home. Emotional support given. Hospital care team made aware, hospice referral made aware. Thank you for the opportunity to be involved in the care of this patient and her family. Dayna BarkerKaren Robertson RN, BSN, [facility] [place], [facility] Liaison [phone] c

## 2016-05-03 NOTE — Care Management (Signed)
Per MD patient actively dying and is to unstable for transfer. Will remain here at this time.

## 2016-05-03 NOTE — Progress Notes (Signed)
Per MD patient is actively dying and not stable for transfer to the [facility] at [facility]. Clydie BraunKaren hospice liaison is aware of above. Clinical Social Worker (CSW) will continue to follow and assist as needed.   Baker Hughes IncorporatedBailey Nevelyn Mellott, LCSW [phone]

## 2016-05-03 NOTE — Progress Notes (Signed)
Nutrition Brief Note  Chart reviewed. Pt now transitioning to comfort care.  No further nutrition interventions warranted at this time.  Please re-consult as needed.   Zula Hovsepian M. Oryan Winterton, MS, RD LDN Inpatient Clinical Dietitian Pager [phone]    

## 2016-05-05 NOTE — Progress Notes (Signed)
[facility] - [place] at [facility]   PATIENT NAME: Rosemae Mcquown    MR#:  [id number]  DATE OF BIRTH:  [birth year]  SUBJECTIVE:  CHIEF COMPLAINT:   Chief Complaint  Patient presents with  . Weakness  . Cough   Laying in bed. Eyes closed.    REVIEW OF SYSTEMS:    Review of Systems  Unable to perform ROS: Mental status change    DRUG ALLERGIES:  No Known Allergies  VITALS:  Blood pressure (!) 97/49, pulse (!) 153, temperature 97.4 F (36.3 C), temperature source Oral, resp. rate 14, height  (1.626 m), weight 36.2 kg (79 lb 12.9 oz), SpO2 (!) 77 %.  PHYSICAL EXAMINATION:   Physical Exam  GENERAL:  83 y.o.-year-old patient lying in the bed EYES: closed HEENT: Head atraumatic, normocephalic. PSYCHIATRIC: Drowzy, shallow breathing  LABORATORY PANEL:   CBC  Recent Labs Lab 04/29/16 0846  WBC 6.8  HGB 13.3  HCT 39.6  PLT 190   ------------------------------------------------------------------------------------------------------------------ Chemistries   Recent Labs Lab 04/30/16 0614  NA 144  K 4.0  CL 104  CO2 34*  GLUCOSE 133*  BUN 34*  CREATININE 0.71  CALCIUM 8.7*   ------------------------------------------------------------------------------------------------------------------  Cardiac Enzymes  Recent Labs Lab 04/30/2016 1657  TROPONINI <0.03   ------------------------------------------------------------------------------------------------------------------  RADIOLOGY:  No results found.   ASSESSMENT AND PLAN:   * Acute respiratory failure with hypoxia due to bilateral Community acquired pneumonia. * COPD exacerbation * Acute toxic encephalopathy over dementia due to inpatient delirium and acute illness * Hyperkalemia  Now on comfort measures due to worsening as per family request.  Patient likely has only a few more hours left at this time. Continue end-of-life care in the hospital.  Morphine and Ativan  PRN  All the records are reviewed and case discussed with Care Management/Social Workerr. Management plans discussed with the patient, family and they are in agreement.  CODE STATUS: DNR  DVT Prophylaxis: SCDs  TOTAL TIME TAKING CARE OF THIS PATIENT: 25 minutes. Greater than 50% time spent in coordinating care  POSSIBLE D/C IN 1-2 DAYS, DEPENDING ON CLINICAL CONDITION.  Milagros Loll R M.D on 05/20/2016 at 12:30 PM  Between 7am to 6pm - Pager - [phone]  After 6pm go to [email or web address] - password [email or web address]  SOUND  Hospitalists  Office  [phone]  CC: Primary care physician; PROVIDER NOT IN SYSTEM  Note: This dictation was prepared with Dragon dictation along with smaller phrase technology. Any transcriptional errors that result from this process are unintentional.

## 2016-05-05 NOTE — Progress Notes (Addendum)
Two RNS listened and observed for signs of life. None noted. Time of death 2100. MD on call notified and house supervisor.

## 2016-05-05 NOTE — Care Management Important Message (Signed)
Important Message  Patient Details  Name: Loretta Saunders MRN: [id number] Date of Birth: 07/14/1929   Medicare Important Message Given:  Yes    Collie SiadAngela Viggo Perko, RN 2016/03/31, 8:33 AM

## 2016-05-05 NOTE — Care Management (Signed)
Rounded on patient's room today; patient is resting comfortably and no family at bedside. Sp[facility] with [facility]Karen at [facility]. There are currently no beds at hospice and patient's skin is mottled. RNCM will follow along.

## 2016-05-05 NOTE — Progress Notes (Signed)
Visit made to provide family support. Patient seen lying in bed, remains unresponsive to voice and touch, respiratory rate 30. Family had questions regarding the oxygen, they report that their mother never liked and asked about removing it. Education provided. Staff RN Loretta Saunders present, PRN dose of liquid morphine given for increased respiratory rate. Oxygen removed. Patient appeared comfortable, respiratory rate 24. Throaty secretions noted, head of bed elevated. More end of life education provided regarding secretions and changes in respiratory patterns. Family again asked about a priest coming to see Loretta Saunders. This was requested on Monday, and they are unsure if one visited. Writer followed up with CSW Recruitment consultantBailey and staff RN Loretta Saunders. On call  chaplain paged and will arrange, family made aware. Thank you. Loretta BarkerKaren Robertson RN, BSN, [facility] [place], hospital Liaison [phone] c

## 2016-05-05 NOTE — Progress Notes (Signed)
Patient continues the dying process, received a dose of liquid morphine this morning for increased work of breathing per Advertising account plannerstaff RN Ana. Discussed with attending physician Dr. Elpidio AnisSudini and [facility] Marylene LandAngela, no bed available at the hosp ice home at this time and family had declined to move patient yesterday. Patient appears comfortable. No family at bedside. Thank you. Dayna BarkerKaren Robertson RN, BSN, [facility] [place], hospital liaison [phone] c

## 2016-05-05 DEATH — deceased

## 2016-06-05 NOTE — Discharge Summary (Signed)
[facility] - [place] at [facility]    DEATH NOTE  PATIENT NAME: Loretta ParmaJanet Saunders    MR#:  [id number]  DATE OF BIRTH:  10/22/1929  DATE OF ADMISSION:  04/27/2016  PRIMARY CARE PHYSICIAN: PROVIDER NOT IN SYSTEM   Pronounced dead  on 05/04/3016 at  11.51 pm            CAUSE OF DEATH - Bilateral community-acquired pneumonia COPD exacerbation  ADMISSION DIAGNOSIS:  Acute respiratory failure with hypoxia and hypercapnia (HCC) [J96.01, J96.02] Altered mental status, unspecified altered mental status type [R41.82] Community acquired pneumonia of right middle lobe of lung (HCC) [J18.1]  HISTORY OF PRESENT ILLNESS ON ADMISSION   HISTORY OF PRESENT ILLNESS:  Loretta ParmaJanet Saunders  is a 83 y.o. female who presents with Progressive weakness and cough for the past 7-10 days, with a significant decline in her mental status for the past 24 hours. Patient has baseline dementia, but is normally fairly high functioning and terms of her ability to ambulate and perform tasks of daily living. Over the past week or so she has had somewhat of a decline with increasing cough, and today she was very lethargic and minimally responsive with family. Here in the ED she was found to have pneumonia and combined hypoxic and hypercapnic respiratory failure. BiPAP was placed and hospitalists were called for admission and further treatment. Family states the patient did not want any significant or extremely aggressive intervention, but is willing to allow the use of BiPAP and IV antibiotics as this may very well be a straightforward and treatable pathology. Patient herself is extremely hard of hearing, and due to this in addition to her current diagnoses she was unable to contribute in any significant way to her history of present illness.Marland Kitchen.   HOSPITAL COURSE:  * Acute respiratory failure with hypoxia due to bilateral Community acquired pneumonia. * COPD exacerbation * Acute toxic encephalopathy over  dementia due to inpatient delirium and acute illness * Hyperkalemia  Patient was admitted to medical floor with aggressive resuscitation of her acute hypoxic respiratory failure and bilateral, discoid pneumonia with IV steroids, IV antibiotics and oxygen support with nebulizers. Patient slowly deteriorated and family wished for her to be transitioned to comfort measures. Patient has had progressive decline over the past few months due to dementia.  He was placed on comfort measures with as needed morphine and Ativan. Patient did not have any pain. Plan was to transfer her to hospice home but initially a bed was not available. When a bed was available patient seemed very unstable and seemed to have only few hours left. At this point patient was continued on comfort measures in the hospital. She was pronounced dead on 09/01/2016 11:51 PM   Milagros LollSudini, Aiyana Stegmann R M.D on 05/11/2016 at 4:50 PM  [facility] [place] at [facility]    OFFICE [phone]  Total clinical and documentation time for today Under 30 minutes
